# Patient Record
Sex: Female | Born: 1995 | Hispanic: No | Marital: Single | Smoking: Never smoker
Health system: Southern US, Community
[De-identification: ages and names within clinical notes are randomized; demographics above are authoritative.]

## PROBLEM LIST (undated history)

## (undated) DIAGNOSIS — D219 Benign neoplasm of connective and other soft tissue, unspecified: Secondary | ICD-10-CM

## (undated) HISTORY — DX: Benign neoplasm of connective and other soft tissue, unspecified: D21.9

---

## 2016-08-05 ENCOUNTER — Emergency Department (HOSPITAL_COMMUNITY)
Admission: EM | Admit: 2016-08-05 | Discharge: 2016-08-05 | Disposition: A | Discharge status: Home / Self Care | Payer: Self-pay | Attending: Emergency Medicine | Admitting: Emergency Medicine

## 2016-08-05 ENCOUNTER — Encounter (HOSPITAL_COMMUNITY): Payer: Self-pay | Admitting: Emergency Medicine

## 2016-08-05 DIAGNOSIS — Y939 Activity, unspecified: Secondary | ICD-10-CM | POA: Insufficient documentation

## 2016-08-05 DIAGNOSIS — X500XXA Overexertion from strenuous movement or load, initial encounter: Secondary | ICD-10-CM | POA: Insufficient documentation

## 2016-08-05 DIAGNOSIS — Y929 Unspecified place or not applicable: Secondary | ICD-10-CM | POA: Insufficient documentation

## 2016-08-05 DIAGNOSIS — Y999 Unspecified external cause status: Secondary | ICD-10-CM | POA: Insufficient documentation

## 2016-08-05 DIAGNOSIS — M67431 Ganglion, right wrist: Secondary | ICD-10-CM | POA: Insufficient documentation

## 2016-08-05 DIAGNOSIS — M6740 Ganglion, unspecified site: Secondary | ICD-10-CM

## 2016-08-05 NOTE — ED Notes (Signed)
Pt has cyst on right hand. Her mother and sister also have these.

## 2016-08-05 NOTE — Discharge Instructions (Signed)
Please read attached information. If you experience any new or worsening signs or symptoms please return to the emergency room for evaluation. Please follow-up with your primary care provider or specialist as discussed.  °

## 2016-08-05 NOTE — ED Provider Notes (Signed)
[place] DEPT Provider Note   CSN: [id number] Arrival date & time: 08/05/16  1049  By signing my name below, I, Theresia Bough, attest that this documentation has been prepared under the direction and in the presence of [employer], PA-C.  Electronically Signed: Theresia Bough, ED Scribe. 08/05/16. 12:25 PM.  History   Chief Complaint Chief Complaint  Patient presents with  . Cyst   The history is provided by the patient. No language interpreter was used.    HPI Comments: Brenda Manning is a 21 y.o. female who presents to the Emergency Department complaining of a moderate, gradually worsening area of pain and swelling to the right dorsal wrist onset 1-2 months ago. Per pt, she has a h/o similar to the same area which have been recurrent over the past nine years. Pt states pain is exacerbated holding heavy objects and with usage of the hand. No noted treatments for her symptoms were tried prior to coming into the ED. She has a maternal FHx of the same. Denies fever, chills, drainage from the area.   History reviewed. No pertinent past medical history.  There are no active problems to display for this patient.  History reviewed. No pertinent surgical history.  OB History    No data available     Home Medications    Prior to Admission medications   Not on File   Family History No family history on file.  Social History Social History  Substance Use Topics  . Smoking status: Not on file  . Smokeless tobacco: Not on file  . Alcohol use Not on file   Allergies   Patient has no known allergies.  Review of Systems Review of Systems  Constitutional: Negative for chills and fever.  Musculoskeletal: Positive for myalgias.  All other systems reviewed and are negative.  Physical Exam Updated Vital Signs BP 130/74   Pulse 66   Temp 98.7 F (37.1 C) (Oral)   Resp 12   LMP 07/15/2016 (Exact Date)   SpO2 100%   Physical Exam  Constitutional: She is oriented  to person, place, and time. She appears well-developed and well-nourished.  HENT:  Head: Normocephalic and atraumatic.  Cardiovascular: Normal rate.   Pulmonary/Chest: Effort normal.  Musculoskeletal:  Ganglion cyst to the right dorsal wrist  Neurological: She is alert and oriented to person, place, and time.  Skin: Skin is warm and dry.  Psychiatric: She has a normal mood and affect.  Nursing note and vitals reviewed.  ED Treatments / Results  DIAGNOSTIC STUDIES: Oxygen Saturation is 100% on RA, normal by my interpretation.   COORDINATION OF CARE: 12:05 PM-Discussed next steps with pt. Pt verbalized understanding and is agreeable with the plan.   Labs (all labs ordered are listed, but only abnormal results are displayed) Labs Reviewed - No data to display  EKG  EKG Interpretation None      Radiology No results found.  Procedures Procedures   Medications Ordered in ED Medications - No data to display  Initial Impression / Assessment and Plan / ED Course  I have reviewed the triage vital signs and the nursing notes.  Pertinent labs & imaging results that were available during my care of the patient were reviewed by me and considered in my medical decision making (see chart for details).     Labs:   Imaging:   Consults:   Therapeutics:   Discharge Meds:  Assessment/Plan:   21 year old female presents today with likely ganglion cyst.  No signs  of infectious etiology.  This is chronic in nature.  She referred to hand surgery, strict return precautions given.  She verbalized understanding and agreement to today's plan had no further questions or concerns  Final Clinical Impressions(s) / ED Diagnoses   Final diagnoses:  Ganglion cyst    New Prescriptions New Prescriptions   No medications on file   I personally performed the services described in this documentation, which was scribed in my presence. The recorded information has been reviewed and is  accurate.    Okey Regal, PA-C 08/05/16 1238    Virgel Manifold, MD 08/09/16 [phone]

## 2016-08-05 NOTE — ED Triage Notes (Signed)
Pt reports recurring cysts on her wrist, current one on right wrist has been present x2 months and has grown in size since initially appearing.

## 2019-07-25 ENCOUNTER — Other Ambulatory Visit: Payer: Self-pay

## 2019-08-04 ENCOUNTER — Emergency Department (HOSPITAL_COMMUNITY)
Admission: EM | Admit: 2019-08-04 | Discharge: 2019-08-05 | Disposition: A | Discharge status: Home / Self Care | Payer: Self-pay | Attending: Emergency Medicine | Admitting: Emergency Medicine

## 2019-08-04 ENCOUNTER — Other Ambulatory Visit: Payer: Self-pay

## 2019-08-04 ENCOUNTER — Encounter (HOSPITAL_COMMUNITY): Payer: Self-pay | Admitting: Emergency Medicine

## 2019-08-04 DIAGNOSIS — R102 Pelvic and perineal pain: Secondary | ICD-10-CM | POA: Insufficient documentation

## 2019-08-04 DIAGNOSIS — R109 Unspecified abdominal pain: Secondary | ICD-10-CM

## 2019-08-04 LAB — CBC
HCT: 38.4 % (ref 36.0–46.0)
Hemoglobin: 12.7 g/dL (ref 12.0–15.0)
MCH: 30 pg (ref 26.0–34.0)
MCHC: 33.1 g/dL (ref 30.0–36.0)
MCV: 90.8 fL (ref 80.0–100.0)
Platelets: 255 10*3/uL (ref 150–400)
RBC: 4.23 MIL/uL (ref 3.87–5.11)
RDW: 12.7 % (ref 11.5–15.5)
WBC: 7 10*3/uL (ref 4.0–10.5)
nRBC: 0 % (ref 0.0–0.2)

## 2019-08-04 LAB — URINALYSIS, ROUTINE W REFLEX MICROSCOPIC
Bilirubin Urine: NEGATIVE
Glucose, UA: NEGATIVE mg/dL
Hgb urine dipstick: NEGATIVE
Ketones, ur: NEGATIVE mg/dL
Leukocytes,Ua: NEGATIVE
Nitrite: NEGATIVE
Protein, ur: NEGATIVE mg/dL
Specific Gravity, Urine: 1.006 (ref 1.005–1.030)
pH: 5 (ref 5.0–8.0)

## 2019-08-04 LAB — I-STAT BETA HCG BLOOD, ED (MC, WL, AP ONLY): I-stat hCG, quantitative: <5 m[IU]/mL (ref <5)

## 2019-08-04 NOTE — ED Triage Notes (Signed)
Pt c/o 9/10 right flank pain radiating all the way to her right lower abd. Pt denies any urinary symptoms.

## 2019-08-05 ENCOUNTER — Emergency Department (HOSPITAL_COMMUNITY): Payer: Self-pay

## 2019-08-05 LAB — BASIC METABOLIC PANEL
Anion gap: 9 (ref 5–15)
BUN: 13 mg/dL (ref 6–20)
CO2: 27 mmol/L (ref 22–32)
Calcium: 9.5 mg/dL (ref 8.9–10.3)
Chloride: 104 mmol/L (ref 98–111)
Creatinine, Ser: 0.83 mg/dL (ref 0.44–1.00)
GFR calc Af Amer: >60 mL/min (ref >60)
GFR calc non Af Amer: >60 mL/min (ref >60)
Glucose, Bld: 96 mg/dL (ref 70–99)
Potassium: 4.1 mmol/L (ref 3.5–5.1)
Sodium: 140 mmol/L (ref 135–145)

## 2019-08-05 NOTE — Discharge Instructions (Addendum)
There are three uterine fibroids on ultrasound.  Follow-up with GYN regarding uterine fibroids as it may contribute to your pain.  Call for appt.

## 2019-08-05 NOTE — ED Notes (Signed)
ED Provider at bedside. 

## 2019-08-05 NOTE — ED Provider Notes (Signed)
[place] EMERGENCY DEPARTMENT Provider Note   CSN: [id number] Arrival date & time: 08/04/19  2230     History Chief Complaint  Patient presents with  . Flank Pain    Brenda Manning is a 24 y.o. female.  The history is provided by the patient and medical records.  Flank Pain   24 y.o. F here with right sided lower abdominal and flank pain.  States this began on Monday and has been worsening since onset.  She states pain radiates into her back, often times has had a hard time getting comfortable.  She reports nausea and vomiting.  No diarrhea.  No fever/chills.  Denies dysuria or hematuria.  No vaginal discharge.  No concern of STD.  History reviewed. No pertinent past medical history.  There are no problems to display for this patient.   History reviewed. No pertinent surgical history.   OB History   No obstetric history on file.     History reviewed. No pertinent family history.  Social History   Tobacco Use  . Smoking status: Never Smoker  . Smokeless tobacco: Never Used  Substance Use Topics  . Alcohol use: Never  . Drug use: Never    Home Medications Prior to Admission medications   Not on File    Allergies    Patient has no known allergies.  Review of Systems   Review of Systems  Genitourinary: Positive for flank pain.  All other systems reviewed and are negative.   Physical Exam Updated Vital Signs BP 114/85   Pulse 77   Temp 97.7 F (36.5 C) (Oral)   Resp (!) 22   Ht 5\' 4"  (1.626 m)   Wt 54.4 kg   SpO2 100%   BMI 20.60 kg/m   Physical Exam Vitals and nursing note reviewed.  Constitutional:      Appearance: She is well-developed.  HENT:     Head: Normocephalic and atraumatic.  Eyes:     Conjunctiva/sclera: Conjunctivae normal.     Pupils: Pupils are equal, round, and reactive to light.  Cardiovascular:     Rate and Rhythm: Normal rate and regular rhythm.     Heart sounds: Normal heart sounds.  Pulmonary:       Effort: Pulmonary effort is normal. No respiratory distress.     Breath sounds: Normal breath sounds. No rhonchi.  Abdominal:     General: Bowel sounds are normal.     Palpations: Abdomen is soft.     Tenderness: There is no abdominal tenderness. There is no rebound.     Comments: Soft, non-tender  Musculoskeletal:        General: Normal range of motion.     Cervical back: Normal range of motion.  Skin:    General: Skin is warm and dry.  Neurological:     Mental Status: She is alert and oriented to person, place, and time.     ED Results / Procedures / Treatments   Labs (all labs ordered are listed, but only abnormal results are displayed) Labs Reviewed  URINALYSIS, ROUTINE W REFLEX MICROSCOPIC - Abnormal; Notable for the following components:      Result Value   Color, Urine STRAW (*)    All other components within normal limits  BASIC METABOLIC PANEL  CBC  I-STAT BETA HCG BLOOD, ED ([facility], [facility], [facility] ONLY)    EKG None  Radiology CT Renal Stone Study  Result Date: 08/05/2019 CLINICAL DATA:  Right flank pain radiating to the right  lower quadrant. EXAM: CT ABDOMEN AND PELVIS WITHOUT CONTRAST TECHNIQUE: Multidetector CT imaging of the abdomen and pelvis was performed following the standard protocol without IV contrast. COMPARISON:  None. FINDINGS: Lower chest: No significant pulmonary nodules or acute consolidative airspace disease. Hepatobiliary: Normal liver size. No liver mass. Normal gallbladder with no radiopaque cholelithiasis. No biliary ductal dilatation. Pancreas: Normal, with no mass or duct dilation. Spleen: Normal size. No mass. Adrenals/Urinary Tract: Normal adrenals. No renal stones. No hydronephrosis. No contour deforming renal masses. Normal bladder. Stomach/Bowel: Normal non-distended stomach. Normal caliber small bowel with no small bowel wall thickening. Appendix not discretely visualized. No significant pericecal inflammatory changes. Normal large bowel with no  diverticulosis, large bowel wall thickening or pericolonic fat stranding. Vascular/Lymphatic: Normal caliber abdominal aorta. No pathologically enlarged lymph nodes in the abdomen or pelvis. Reproductive: Bulky myomatous uterus with peripherally calcified 5.2 cm posterior uterine body fibroid and with a large solid 9.6 x 6.1 cm right pelvic mass (series 3/image 54) appearing to represent an exophytic right fundal uterine fibroid. No left adnexal mass. Other: No pneumoperitoneum, ascites or focal fluid collection. Musculoskeletal: No aggressive appearing focal osseous lesions. IMPRESSION: 1. Bulky myomatous uterus with a large solid 9.6 x 6.1 cm right pelvic mass, appearing to represent an exophytic right fundal uterine fibroid. Recommend correlation with transabdominal and transvaginal pelvic ultrasound. 2. No urolithiasis. No hydronephrosis. No acute bowel abnormality on this noncontrast scan. Electronically Signed   By: Ilona Sorrel M.D.   On: 08/05/2019 05:27    Procedures Procedures (including critical care time)  Medications Ordered in ED Medications - No data to display  ED Course  I have reviewed the triage vital signs and the nursing notes.  Pertinent labs & imaging results that were available during my care of the patient were reviewed by me and considered in my medical decision making (see chart for details).    MDM Rules/Calculators/A&P  24 y.o. F here with right sided abdominal/flank pain.  Has been worsening since Monday.  No urinary symptoms, vaginal discharge, or unusual bleeding.  She is afebrile, non-toxic.  Abdomen overall soft and non-tender.  Labs from triage are reassuring.  CT renal study with findings of likely uterine fibroids but pelvic US recommended.  Patient denies history of fibroids but reports family history of same (mother and aunt).  Pelvic US pending.  Can likely discharge home with GYN follow-up for fibroids if no acute/emergent findings.  Care signed out to PA  Rona Ravens to follow-up on US findings and disposition.  Final Clinical Impression(s) / ED Diagnoses Final diagnoses:  Pelvic pain  Right flank pain    Rx / DC Orders ED Discharge Orders    None       Larene Pickett, PA-C 08/05/19 0709    Ward, Delice Bison, DO 08/05/19 [phone]

## 2019-08-16 ENCOUNTER — Encounter: Payer: Self-pay | Admitting: Physician Assistant

## 2019-08-16 ENCOUNTER — Other Ambulatory Visit: Payer: Self-pay

## 2019-08-16 ENCOUNTER — Ambulatory Visit: Payer: Medicaid Other | Admitting: Physician Assistant

## 2019-08-16 ENCOUNTER — Other Ambulatory Visit: Payer: Self-pay | Admitting: Physician Assistant

## 2019-08-16 VITALS — BP 108/89 | HR 81 | Temp 98.2°F | Resp 18

## 2019-08-16 DIAGNOSIS — D219 Benign neoplasm of connective and other soft tissue, unspecified: Secondary | ICD-10-CM

## 2019-08-16 MED ORDER — IBUPROFEN 800 MG PO TABS: 800.0000 mg | ORAL_TABLET | Freq: Three times a day (TID) | ORAL | 0 refills | Status: AC | PRN

## 2019-08-16 NOTE — Patient Instructions (Signed)

## 2019-08-16 NOTE — Progress Notes (Unsigned)
New Patient Office Visit  Subjective:  Patient ID: Brenda Manning, female    DOB: Jun 22, 1995  Age: 24 y.o. MRN: [id number]  CC: No chief complaint on file.   HPI Brenda Manning presents for ***   24 y.o. F here with right sided abdominal/flank pain.  Has been worsening since Monday.  No urinary symptoms, vaginal discharge, or unusual bleeding.  She is afebrile, non-toxic.  Abdomen overall soft and non-tender.  Labs from triage are reassuring.  CT renal study with findings of likely uterine fibroids but pelvic US recommended.  Patient denies history of fibroids but reports family history of same (mother and aunt).  Pelvic US pending.  Can likely discharge home with GYN follow-up for fibroids if no acute/emergent findings.  Care signed out to PA Rona Ravens to follow-up on US findings and disposition.  FINDINGS: Uterus  Measurements: 11.4 x 7.8 x 6.4 = volume: 298 mL. Three fibroids are identified:  6.6 x 5.9 x 6.5 cm exophytic  5 x 5.4 x 5.1 cm likely with a submucosal component  3.1 x 1.9 x 2.7 cm with a subserosal component  Endometrium  Thickness: 7 mm.  No focal abnormality visualized.  Right ovary  Measurements: 4.1 x 2 x 4 cm = volume: 17.3 mL. Normal appearance/no adnexal mass.  Left ovary  Measurements: 3.3 x 1.4 x 1.6 cm = volume: 3.8 mL. Normal appearance/no adnexal mass.  Pulsed Doppler evaluation demonstrates normal low-resistance arterial and venous waveforms in right ovary. Suboptimal Doppler evaluation of the left ovary due to location.  Other: Trace free fluid, likely physiologic  IMPRESSION: Myomatous uterus.  Ovaries are unremarkable.   Electronically Signed   By: Macy Mis M.D.   On: 08/05/2019 07:10  No past medical history on file.  No past surgical history on file.  No family history on file.  Social History   Socioeconomic History  . Marital status: Single    Spouse name: Not on file  . Number of  children: Not on file  . Years of education: Not on file  . Highest education level: Not on file  Occupational History  . Not on file  Tobacco Use  . Smoking status: Never Smoker  . Smokeless tobacco: Never Used  Substance and Sexual Activity  . Alcohol use: Never  . Drug use: Never  . Sexual activity: Not on file  Other Topics Concern  . Not on file  Social History Narrative  . Not on file   Social Determinants of Health   Financial Resource Strain:   . Difficulty of Paying Living Expenses:   Food Insecurity:   . Worried About Charity fundraiser in the Last Year:   . Arboriculturist in the Last Year:   Transportation Needs:   . Film/video editor (Medical):   Marland Kitchen Lack of Transportation (Non-Medical):   Physical Activity:   . Days of Exercise per Week:   . Minutes of Exercise per Session:   Stress:   . Feeling of Stress :   Social Connections:   . Frequency of Communication with Friends and Family:   . Frequency of Social Gatherings with Friends and Family:   . Attends Religious Services:   . Active Member of Clubs or Organizations:   . Attends Archivist Meetings:   Marland Kitchen Marital Status:   Intimate Partner Violence:   . Fear of Current or Ex-Partner:   . Emotionally Abused:   Marland Kitchen Physically Abused:   . Sexually Abused:  ROS Review of Systems  Objective:   Today's Vitals: There were no vitals taken for this visit.  Physical Exam  Assessment & Plan:   Problem List Items Addressed This Visit    None      Outpatient Encounter Medications as of 08/16/2019  Medication Sig  . ibuprofen (ADVIL) 800 MG tablet Take 1 tablet (800 mg total) by mouth every 8 (eight) hours as needed.   No facility-administered encounter medications on file as of 08/16/2019.    Follow-up: No follow-ups on file.   Loraine Grip Mayers, PA-C

## 2019-08-16 NOTE — Progress Notes (Signed)
New Patient Office Visit  Subjective:  Patient ID: Brenda Manning, female    DOB: 01-08-1996  Age: 24 y.o. MRN: [id number]  CC:  Chief Complaint  Patient presents with  . Abdominal Pain    uterine fibroids    HPI [name]  Reports that she was seen at [facility] emergency department on May 1 with the same complaint.  Summary and results of transvaginal ultrasound: 24 y.o. F here with right sided abdominal/flank pain. Has been worsening since Monday. No urinary symptoms, vaginal discharge, or unusual bleeding. She is afebrile, non-toxic. Abdomen overall soft and non-tender. Labs from triage are reassuring. CT renal study with findings of likely uterine fibroids but pelvic US recommended. Patient denies history of fibroids but reports family history of same (mother and aunt).  Pelvic US pending. Can likely discharge home with GYN follow-up for fibroids if no acute/emergent findings. Care signed out to PA Rona Ravens to follow-up on US findings and disposition.  FINDINGS: Uterus  Measurements: 11.4 x 7.8 x 6.4 = volume: 298 mL. Three fibroids are identified:  6.6 x 5.9 x 6.5 cm exophytic  5 x 5.4 x 5.1 cm likely with a submucosal component  3.1 x 1.9 x 2.7 cm with a subserosal component  Endometrium  Thickness: 7 mm. No focal abnormality visualized.  Right ovary  Measurements: 4.1 x 2 x 4 cm = volume: 17.3 mL. Normal appearance/no adnexal mass.  Left ovary  Measurements: 3.3 x 1.4 x 1.6 cm = volume: 3.8 mL. Normal appearance/no adnexal mass.  Pulsed Doppler evaluation demonstrates normal low-resistance arterial and venous waveforms in right ovary. Suboptimal Doppler evaluation of the left ovary due to location.  Other: Trace free fluid, likely physiologic  IMPRESSION: Myomatous uterus.  Ovaries are unremarkable.   Electronically Signed By: Macy Mis M.D. On: 08/05/2019 07:10  Reports that she continues to have  pelvic pain, has not tried anything for relief, has upcoming appointment at [facility] [place] for further evaluation, but unfortunately appointment is not till the end of June.   History reviewed. No pertinent past medical history.  History reviewed. No pertinent surgical history.  History reviewed. No pertinent family history.  Social History   Socioeconomic History  . Marital status: Single    Spouse name: Not on file  . Number of children: Not on file  . Years of education: Not on file  . Highest education level: Not on file  Occupational History  . Not on file  Tobacco Use  . Smoking status: Never Smoker  . Smokeless tobacco: Never Used  Substance and Sexual Activity  . Alcohol use: Never  . Drug use: Never  . Sexual activity: Not Currently  Other Topics Concern  . Not on file  Social History Narrative  . Not on file   Social Determinants of Health   Financial Resource Strain:   . Difficulty of Paying Living Expenses:   Food Insecurity:   . Worried About Charity fundraiser in the Last Year:   . Arboriculturist in the Last Year:   Transportation Needs:   . Film/video editor (Medical):   Marland Kitchen Lack of Transportation (Non-Medical):   Physical Activity:   . Days of Exercise per Week:   . Minutes of Exercise per Session:   Stress:   . Feeling of Stress :   Social Connections:   . Frequency of Communication with Friends and Family:   . Frequency of Social Gatherings with Friends and Family:   .  Attends Religious Services:   . Active Member of Clubs or Organizations:   . Attends Archivist Meetings:   Marland Kitchen Marital Status:   Intimate Partner Violence:   . Fear of Current or Ex-Partner:   . Emotionally Abused:   Marland Kitchen Physically Abused:   . Sexually Abused:     ROS Review of Systems  Constitutional: Negative for chills and fever.  HENT: Negative.   Eyes: Negative.   Respiratory: Negative.   Cardiovascular: Negative.   Gastrointestinal: Negative.    Endocrine: Negative.   Genitourinary: Positive for flank pain and pelvic pain. Negative for vaginal bleeding.  Skin: Negative.   Allergic/Immunologic: Negative.   Neurological: Negative.   Hematological: Negative.   Psychiatric/Behavioral: Negative.     Objective:   Today's Vitals: BP 108/89 (BP Location: Left Arm, Patient Position: Sitting, Cuff Size: Normal)   Pulse 81   Temp 98.2 F (36.8 C) (Temporal)   Resp 18   SpO2 100%   Physical Exam Vitals and nursing note reviewed.  Constitutional:      General: She is not in acute distress.    Appearance: She is well-developed and normal weight. She is not ill-appearing.  HENT:     Head: Normocephalic and atraumatic.     Mouth/Throat:     Mouth: Mucous membranes are moist.     Pharynx: Oropharynx is clear.  Eyes:     Extraocular Movements: Extraocular movements intact.     Pupils: Pupils are equal, round, and reactive to light.  Cardiovascular:     Rate and Rhythm: Normal rate and regular rhythm.     Heart sounds: Normal heart sounds.  Pulmonary:     Effort: Pulmonary effort is normal.     Breath sounds: Normal breath sounds.  Abdominal:     General: Abdomen is flat. Bowel sounds are normal.     Palpations: Abdomen is rigid.     Tenderness: There is abdominal tenderness in the suprapubic area.  Skin:    General: Skin is warm and dry.  Neurological:     General: No focal deficit present.     Mental Status: She is alert.  Psychiatric:        Mood and Affect: Mood normal.        Behavior: Behavior normal.     Assessment & Plan:   Problem List Items Addressed This Visit    None    Visit Diagnoses    Fibroids    -  Primary    1. Fibroids Trial ibuprofen 800 mg every 8 hours as needed, we were able to get patient an earlier appointment, new appointment is on June 1 at [facility].  I have reviewed the patient's medical history (PMH, PSH, Social History, Family History, Medications, and allergies) , and  have been updated if relevant. I spent 20 minutes reviewing chart and  face to face time with patient.      No outpatient encounter medications on file as of 08/16/2019.   No facility-administered encounter medications on file as of 08/16/2019.    Follow-up: Return in about 3 weeks (around 09/06/2019) [facility].   Loraine Grip Senita Corredor, PA-C

## 2019-09-06 ENCOUNTER — Other Ambulatory Visit (HOSPITAL_COMMUNITY)
Admission: RE | Admit: 2019-09-06 | Discharge: 2019-09-06 | Disposition: A | Discharge status: Home / Self Care | Payer: 59 | Source: Ambulatory Visit | Attending: Obstetrics and Gynecology | Admitting: Obstetrics and Gynecology

## 2019-09-06 ENCOUNTER — Encounter: Payer: Self-pay | Admitting: Obstetrics and Gynecology

## 2019-09-06 ENCOUNTER — Other Ambulatory Visit: Payer: Self-pay

## 2019-09-06 ENCOUNTER — Ambulatory Visit (INDEPENDENT_AMBULATORY_CARE_PROVIDER_SITE_OTHER): Payer: 59 | Admitting: Obstetrics and Gynecology

## 2019-09-06 VITALS — BP 117/67 | HR 67 | Ht 62.0 in | Wt 117.8 lb

## 2019-09-06 DIAGNOSIS — Z124 Encounter for screening for malignant neoplasm of cervix: Secondary | ICD-10-CM

## 2019-09-06 DIAGNOSIS — Z113 Encounter for screening for infections with a predominantly sexual mode of transmission: Secondary | ICD-10-CM

## 2019-09-06 DIAGNOSIS — R102 Pelvic and perineal pain: Secondary | ICD-10-CM | POA: Diagnosis not present

## 2019-09-06 DIAGNOSIS — D259 Leiomyoma of uterus, unspecified: Secondary | ICD-10-CM | POA: Diagnosis not present

## 2019-09-06 MED ORDER — NORGESTIMATE-ETH ESTRADIOL 0.25-35 MG-MCG PO TABS
1.0000 | ORAL_TABLET | Freq: Every day | ORAL | 11 refills | Status: DC
Start: 2019-09-06 — End: 2020-07-15

## 2019-09-06 NOTE — Progress Notes (Signed)
GYNECOLOGY OFFICE FOLLOW UP NOTE  History:  24 y.o. G0P0000 here today for follow up for pelvic pain. Has been feeling something "flipping on her right side" for about 5 months. Period 2 months ago was very painful and went to ED because of the pain. Here for follow up. Last months period was painful but not as bad. Periods are irregular, will skip every 3rd month or so, lasting 2-3 days. Periods are light. Periods have always been irregular and light but pain is worsening.   Uses condoms for contraception.  Desires STI screen.  Past Medical History:  Diagnosis Date  . Fibroids     History reviewed. No pertinent surgical history.   Current Outpatient Medications:  .  ibuprofen (ADVIL) 800 MG tablet, Take 1 tablet (800 mg total) by mouth every 8 (eight) hours as needed., Disp: 30 tablet, Rfl: 0 .  norgestimate-ethinyl estradiol (ORTHO-CYCLEN) 0.25-35 MG-MCG tablet, Take 1 tablet by mouth daily., Disp: 1 Package, Rfl: 11  The following portions of the patient's history were reviewed and updated as appropriate: allergies, current medications, past family history, past medical history, past social history, past surgical history and problem list.   Review of Systems:  Pertinent items noted in HPI and remainder of comprehensive ROS otherwise negative.   Objective:  Physical Exam BP 117/67   Pulse 67   Ht 5\' 2"  (1.575 m)   Wt 117 lb 12.8 oz (53.4 kg)   LMP 08/30/2019   BMI 21.55 kg/m  CONSTITUTIONAL: Well-developed, well-nourished female in no acute distress.  HENT:  Normocephalic, atraumatic. External right and left ear normal. Oropharynx is clear and moist EYES: Conjunctivae and EOM are normal. Pupils are equal, round, and reactive to light. No scleral icterus.  NECK: Normal range of motion, supple, no masses SKIN: Skin is warm and dry. No rash noted. Not diaphoretic. No erythema. No pallor. NEUROLOGIC: Alert and oriented to person, place, and time. Normal reflexes, muscle tone  coordination. No cranial nerve deficit noted. PSYCHIATRIC: Normal mood and affect. Normal behavior. Normal judgment and thought content. CARDIOVASCULAR: Normal heart rate noted RESPIRATORY: Effort normal, no problems with respiration noted ABDOMEN: Soft, no distention noted.   PELVIC: normal appearing extrenal female genitalia, normal appearing vaginal mucosa and cervix, enlarged uterus to umbilicus, mobile and mildly tender, no adnexal masses palpable but mild right adnexal tenderness MUSCULOSKELETAL: Normal range of motion. No edema noted.  Exam done with chaperone present.  Labs and Imaging No results found.  Assessment & Plan:   1. Uterine leiomyoma, unspecified location - Reviewed fibroids, etiology, expected course, likelihood that she will need removal at some point given age and size - reviewed that as her pain is mostly during her period, stoppign ovulation may improve pain, but may not improve pain related to fibroid movement, she would like to try this first - she would like to attempt pregnancy at some point, reviewed that fibroids may interfere with pregnancy but would not recommend proceeding with removal given risks of surgery and also that she has not attempted to get pregnant and may not be an issue - pt verbalizes understanding of the above will start OCPs and f/u 3 months  2. Pelvic pain See above  3. Routine screening for STI (sexually transmitted infection) - Hepatitis B surface antigen - Hepatitis C antibody - HIV Antibody (routine testing w rflx) - RPR  4. Cervical cancer screening - Cytology - PAP( [place])   Routine preventative health maintenance measures emphasized. Please refer to After  Visit Summary for other counseling recommendations.   Return in about 3 months (around 12/07/2019) for Followup.  Total face-to-face time with patient: 25 minutes. Over 50% of encounter was spent on counseling and coordination of care.  Feliz Beam,  M.D. Attending Center for [employer] [facility])

## 2019-09-07 LAB — HIV ANTIBODY (ROUTINE TESTING W REFLEX): HIV Screen 4th Generation wRfx: NONREACTIVE

## 2019-09-07 LAB — HEPATITIS B SURFACE ANTIGEN: Hepatitis B Surface Ag: NEGATIVE

## 2019-09-07 LAB — HEPATITIS C ANTIBODY: Hep C Virus Ab: <0.1 s/co ratio (ref 0.0–0.9)

## 2019-09-07 LAB — RPR: RPR Ser Ql: NONREACTIVE

## 2019-09-10 LAB — CYTOLOGY - PAP
Chlamydia: NEGATIVE
Comment: NEGATIVE
Comment: NEGATIVE
Comment: NORMAL
Diagnosis: NEGATIVE
Neisseria Gonorrhea: NEGATIVE
Trichomonas: NEGATIVE

## 2019-09-24 ENCOUNTER — Encounter: Payer: Medicaid Other | Admitting: Obstetrics & Gynecology

## 2020-04-29 ENCOUNTER — Ambulatory Visit: Payer: 59 | Attending: Internal Medicine

## 2020-04-29 DIAGNOSIS — Z23 Encounter for immunization: Secondary | ICD-10-CM

## 2020-04-29 NOTE — Progress Notes (Signed)
   Covid-19 Vaccination Clinic  Name:  Veronnica Hennings    MRN: [id number] DOB: 1995-06-27  04/29/2020  Ms. Kassabian was observed post Covid-19 immunization for 15 minutes without incident. She was provided with Vaccine Information Sheet and instruction to access the V-Safe system.   Ms. Leicht was instructed to call 911 with any severe reactions post vaccine: Marland Kitchen Difficulty breathing  . Swelling of face and throat  . A fast heartbeat  . A bad rash all over body  . Dizziness and weakness   Immunizations Administered    Name Date Dose VIS Date Route   Moderna COVID-19 Vaccine 04/29/2020  3:06 PM 0.5 mL 01/23/2020 Intramuscular   Manufacturer: Moderna   Lot: [id number]   [place]: [id number]

## 2020-05-27 ENCOUNTER — Ambulatory Visit: Payer: 59

## 2020-07-15 ENCOUNTER — Other Ambulatory Visit: Payer: Self-pay | Admitting: Family Medicine

## 2020-07-15 ENCOUNTER — Other Ambulatory Visit: Payer: Self-pay | Admitting: Obstetrics and Gynecology

## 2020-07-15 MED ORDER — NORGESTIMATE-ETH ESTRADIOL 0.25-35 MG-MCG PO TABS: 1.0000 | ORAL_TABLET | Freq: Every day | ORAL | 3 refills | Status: DC

## 2021-04-27 IMAGING — CT CT RENAL STONE PROTOCOL
2 of 4 series · 15 of 46 positions shown, 17 images · non-contrast
Comparison: None.

CLINICAL DATA: Right flank pain radiating to the right lower
quadrant.

EXAM:
CT ABDOMEN AND PELVIS WITHOUT CONTRAST
TECHNIQUE: Multidetector CT imaging of the abdomen and pelvis was performed
following the standard protocol without IV contrast.

[Series 3: renal stone 5.0 · axial · 0.79mm/px · z∈[+826,+1221]mm · 12 of 91 slices shown, 14 images]
[im 8/91  soft-tissue]
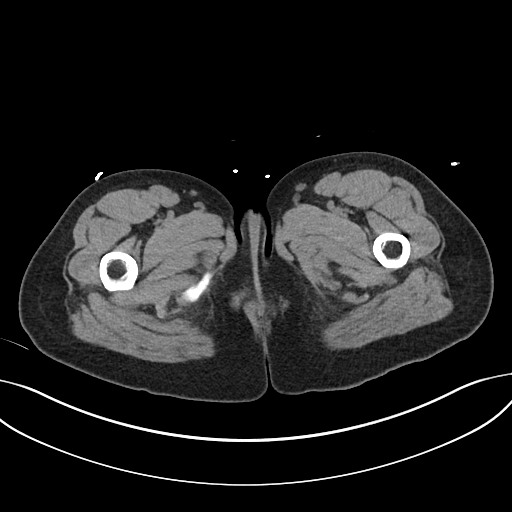
[im 8/91  bone]
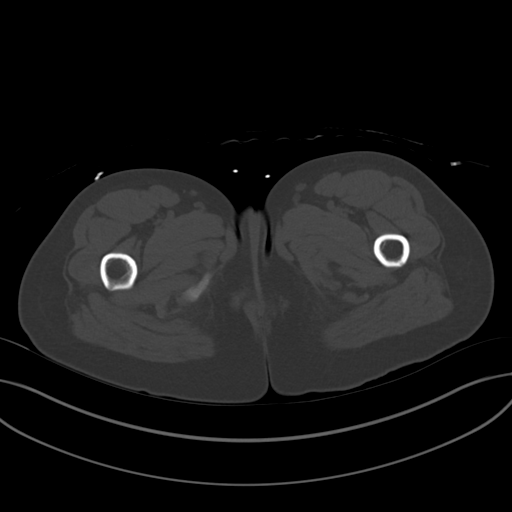
[im 15/91  soft-tissue]
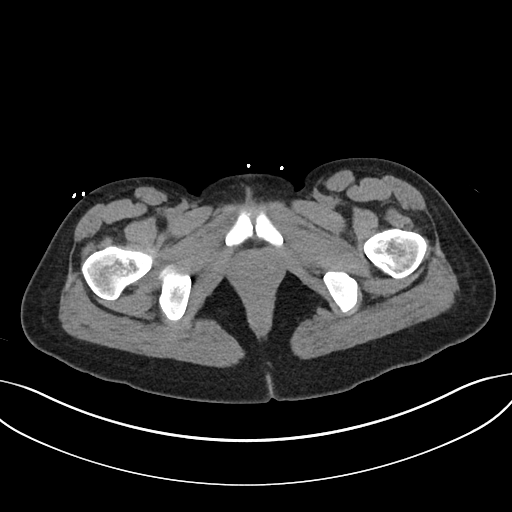
[im 22/91  soft-tissue]
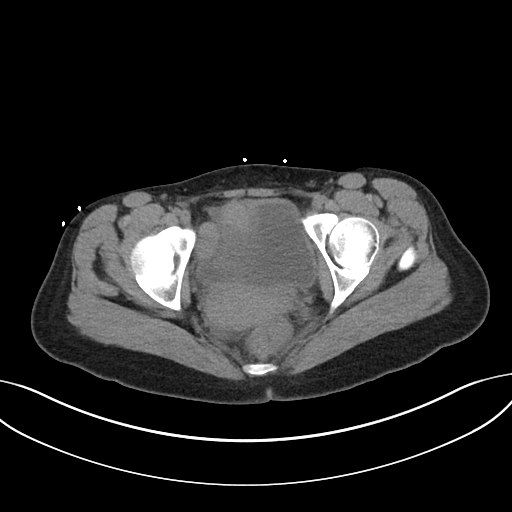
[im 29/91  soft-tissue]
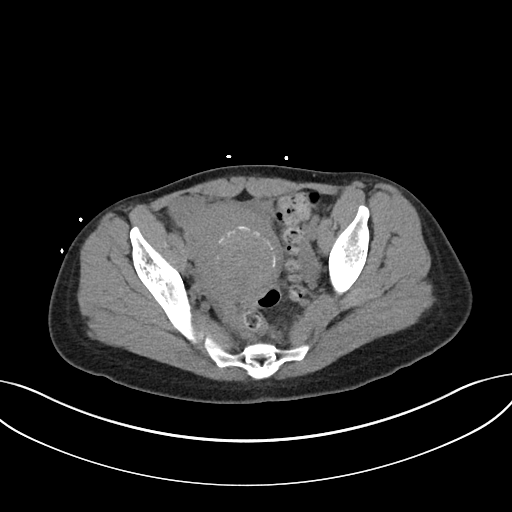
[im 37/91  soft-tissue]
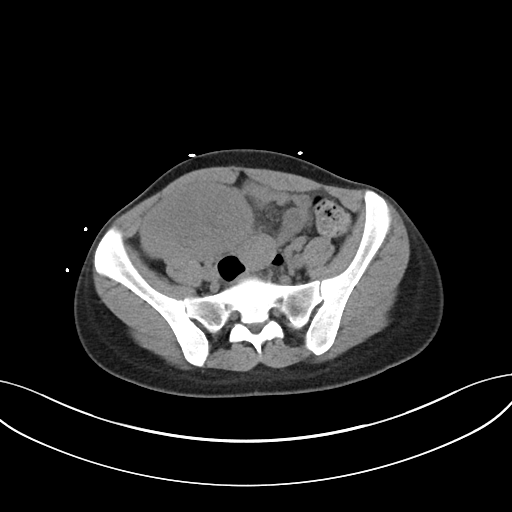
[im 44/91  soft-tissue]
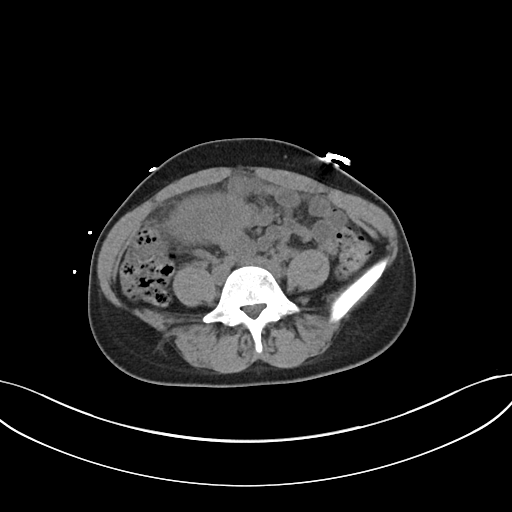
[im 51/91  soft-tissue]
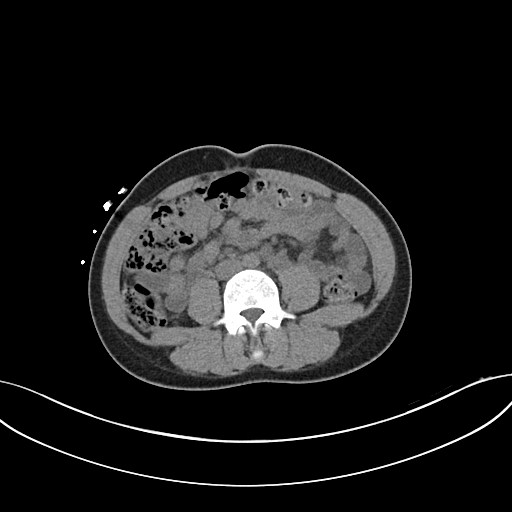
[im 58/91  soft-tissue]
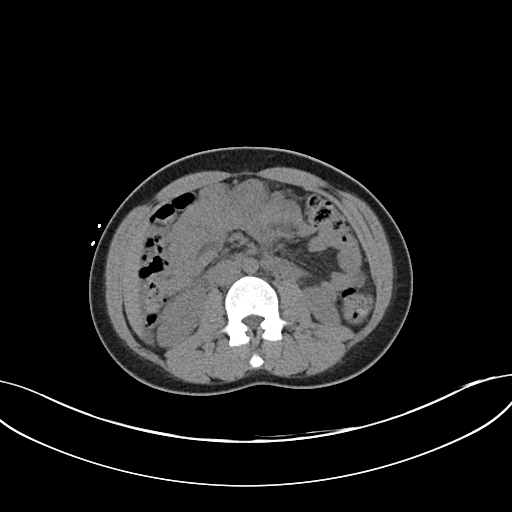
[im 65/91  soft-tissue]
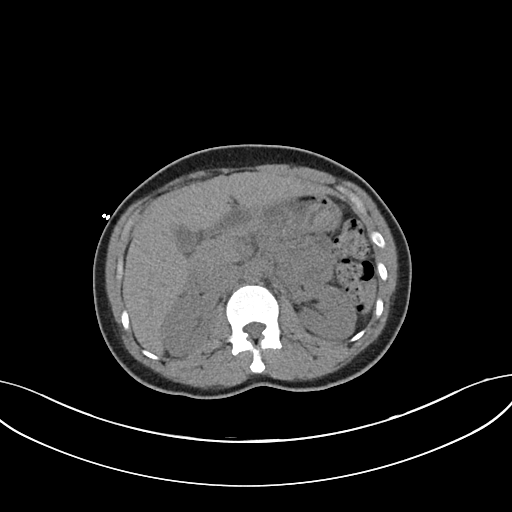
[im 65/91  bone]
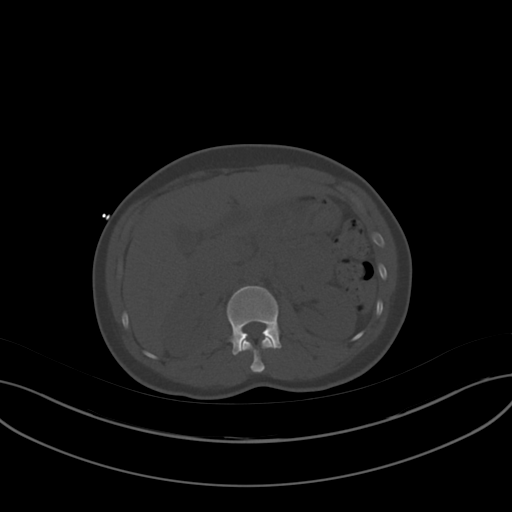
[im 73/91  soft-tissue]
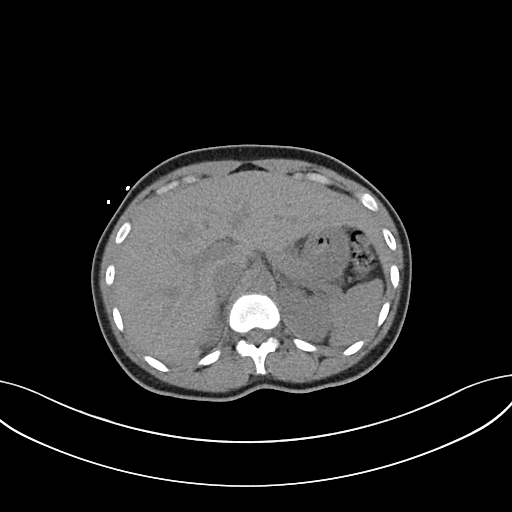
[im 80/91  soft-tissue]
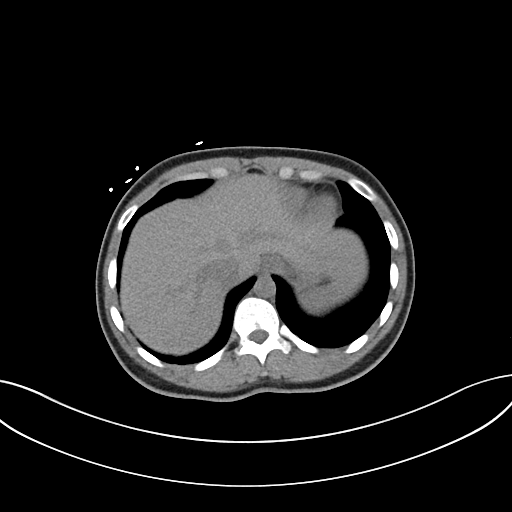
[im 87/91  soft-tissue]
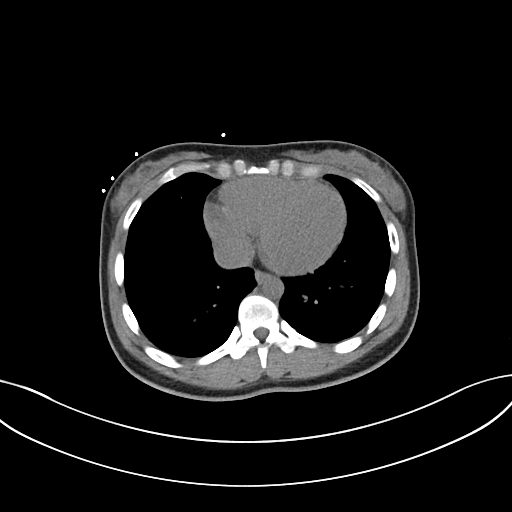

[Series 6: coronal · coronal · 0.61mm/px · 3 of 99 slices shown]
[im 33/99  soft-tissue]
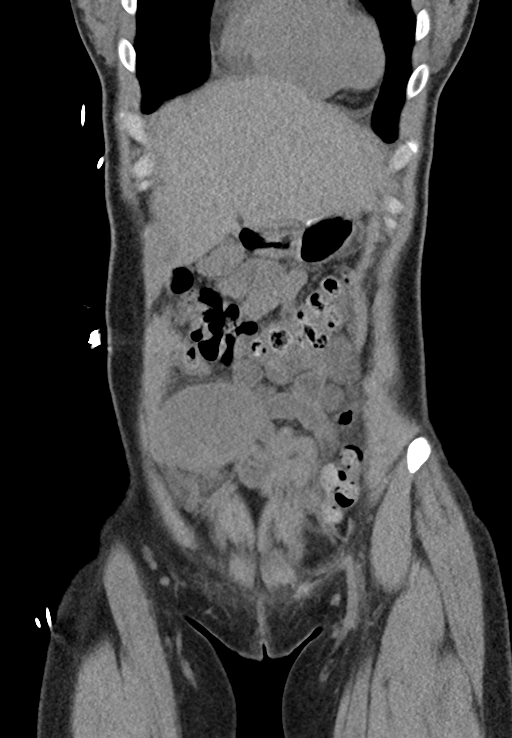
[im 44/99  soft-tissue]
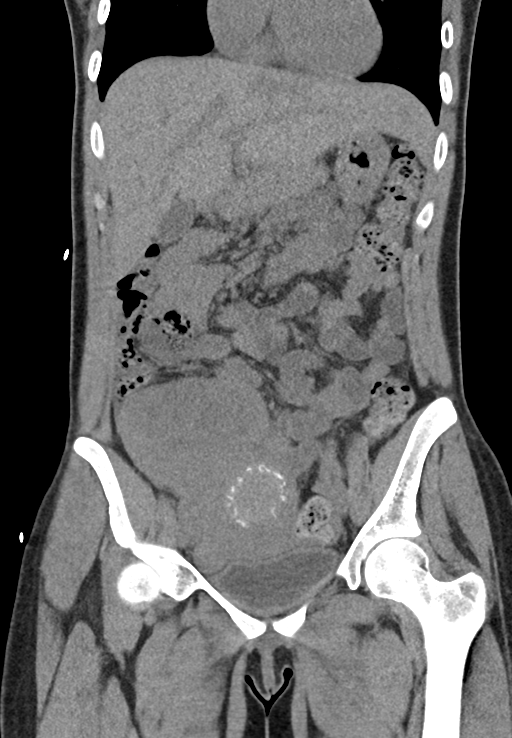
[im 55/99  soft-tissue]
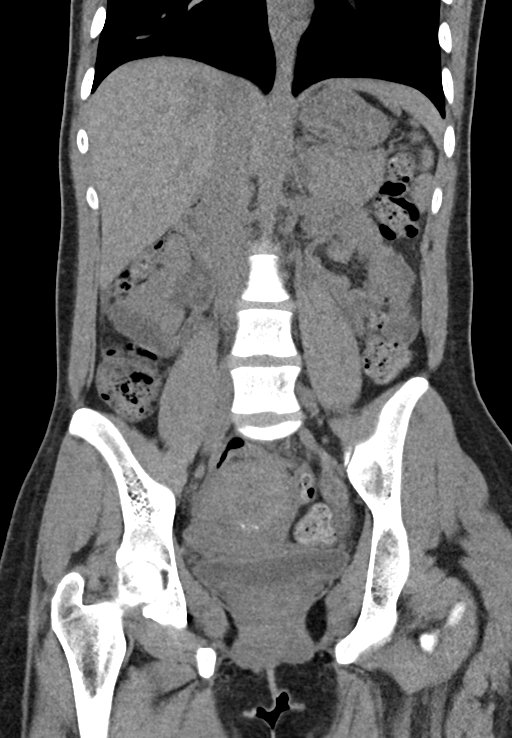

[15 of 46 positions shown; findings below may reference images not displayed]

FINDINGS: Lower chest: No significant pulmonary nodules or acute consolidative
airspace disease.

Hepatobiliary: Normal liver size. No liver mass. Normal gallbladder
with no radiopaque cholelithiasis. No biliary ductal dilatation.

Pancreas: Normal, with no mass or duct dilation.

Spleen: Normal size. No mass.

Adrenals/Urinary Tract: Normal adrenals. No renal stones. No
hydronephrosis. No contour deforming renal masses. Normal bladder.

Stomach/Bowel: Normal non-distended stomach. Normal caliber small
bowel with no small bowel wall thickening. Appendix not discretely
visualized. No significant pericecal inflammatory changes. Normal
large bowel with no diverticulosis, large bowel wall thickening or
pericolonic fat stranding.

Vascular/Lymphatic: Normal caliber abdominal aorta. No
pathologically enlarged lymph nodes in the abdomen or pelvis.

Reproductive: Bulky myomatous uterus with peripherally calcified
cm posterior uterine body fibroid and with a large solid 9.6 x
cm right pelvic mass (series 3/image 54) appearing to represent an
exophytic right fundal uterine fibroid. No left adnexal mass.

Other: No pneumoperitoneum, ascites or focal fluid collection.

Musculoskeletal: No aggressive appearing focal osseous lesions.
IMPRESSION: 1. Bulky myomatous uterus with a large solid 9.6 x 6.1 cm right
pelvic mass, appearing to represent an exophytic right fundal
uterine fibroid. Recommend correlation with transabdominal and
transvaginal pelvic ultrasound.
2. No urolithiasis. No hydronephrosis. No acute bowel abnormality on
this noncontrast scan.

## 2021-04-27 IMAGING — US US PELVIS COMPLETE
1 series · 14 of 25 positions shown · non-contrast
Comparison: None.

CLINICAL DATA: Fibroid uterus on CT

EXAM:
TRANSABDOMINAL ULTRASOUND OF PELVIS
DOPPLER ULTRASOUND OF OVARIES
TECHNIQUE: Transabdominal ultrasound examination of the pelvis was performed
including evaluation of the uterus, ovaries, adnexal regions, and
pelvic cul-de-sac.
Color and duplex Doppler ultrasound was utilized to evaluate blood
flow to the ovaries.

[Series 1: us pelvis complete · 71 acquisitions, 14 frames shown]
[im 1/71]
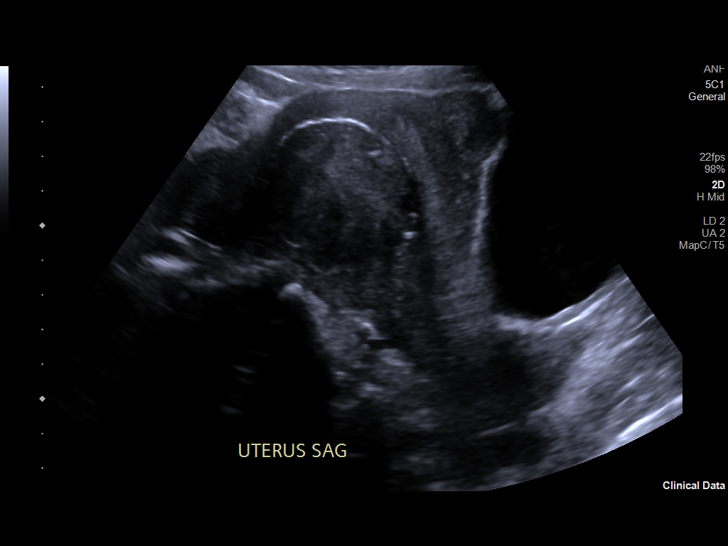
[im 6/71]
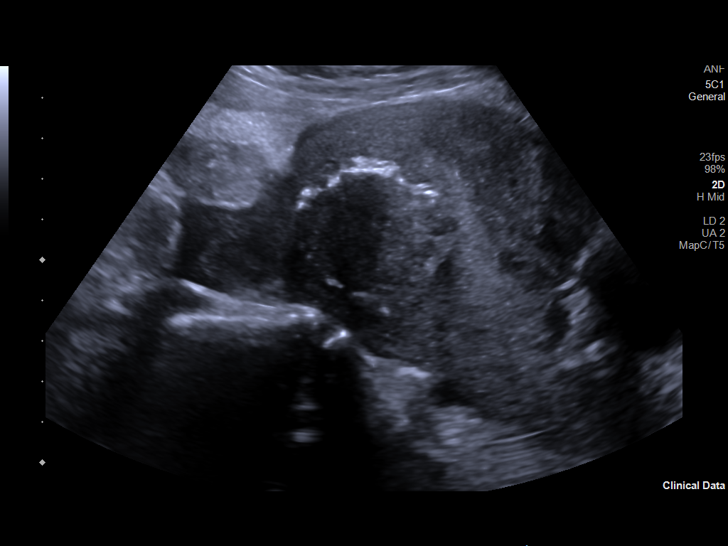
[im 12/71]
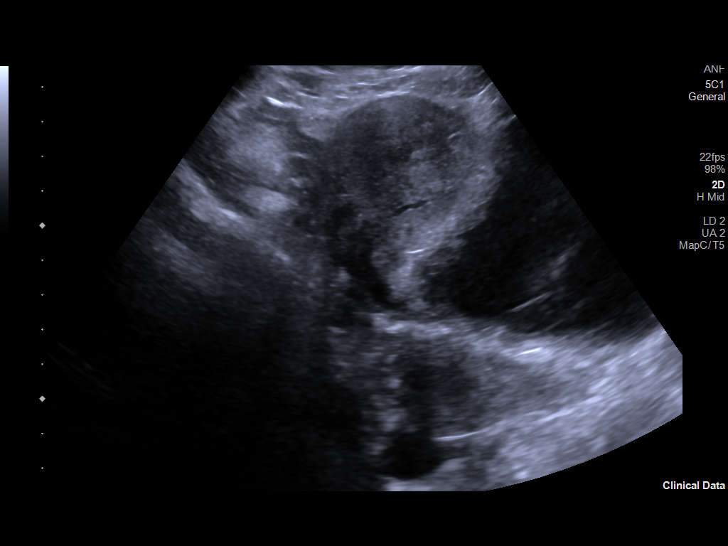
[im 18/71]
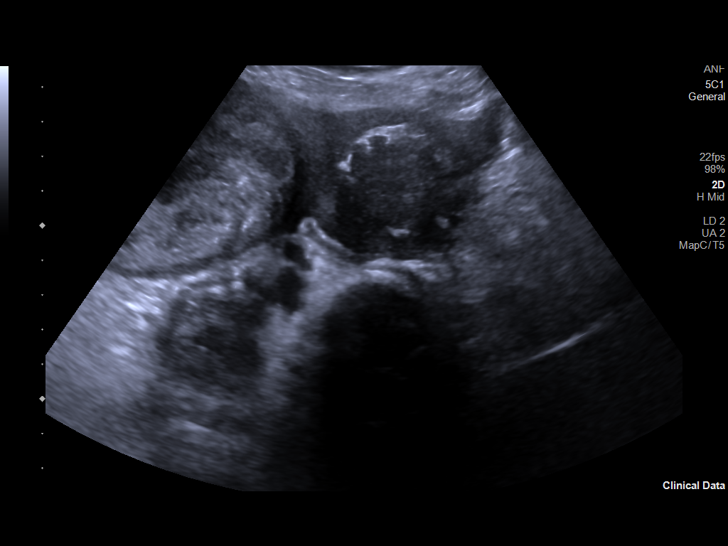
[im 24/71]
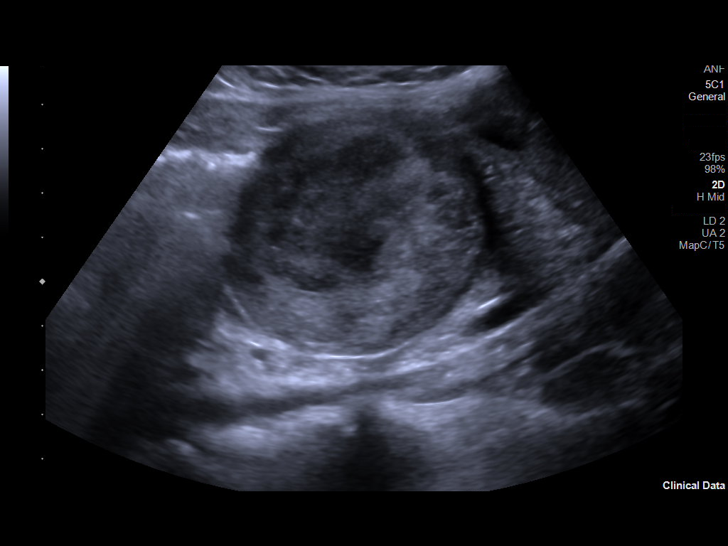
[im 27/71]
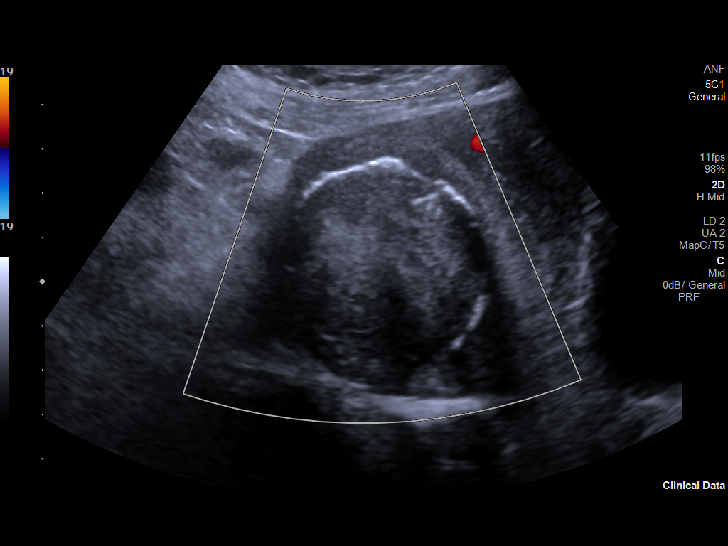
[im 33/71]
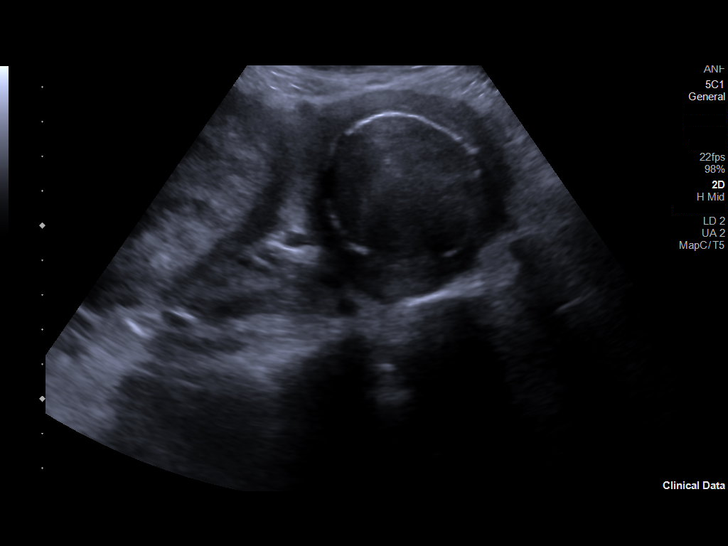
[im 38/71]
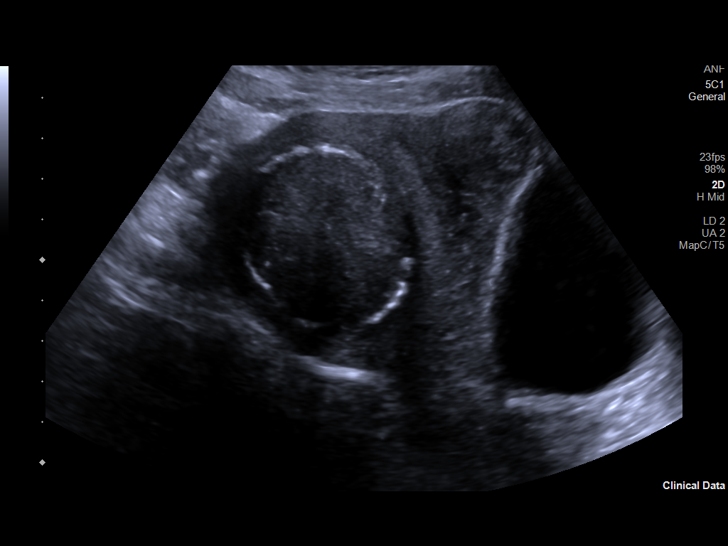
[im 44/71]
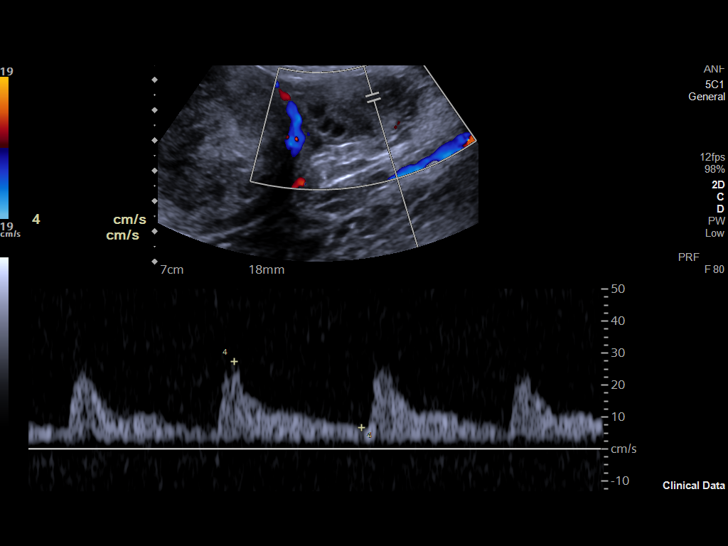
[im 47/71]
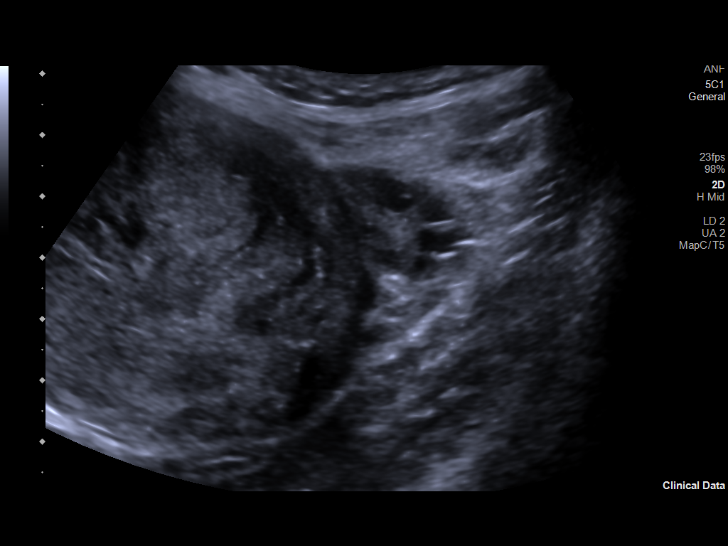
[im 53/71]
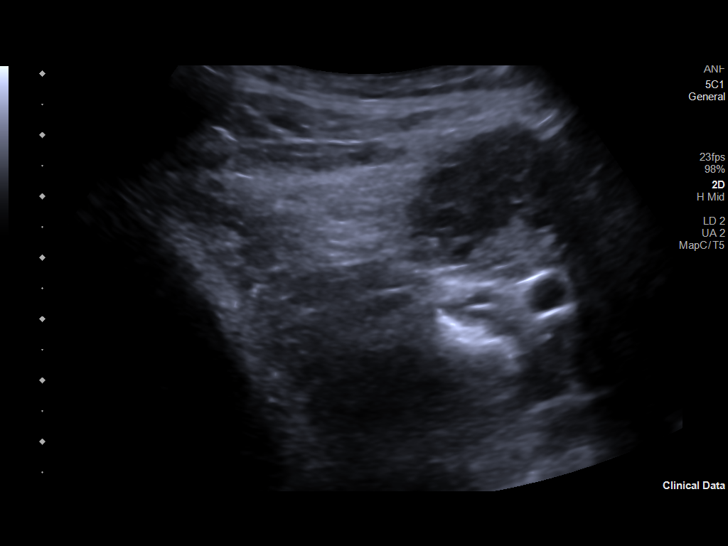
[im 59/71]
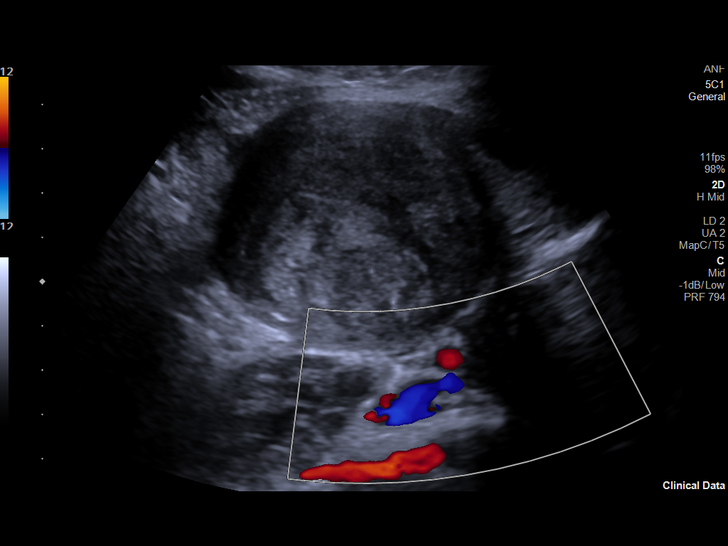
[im 65/71]
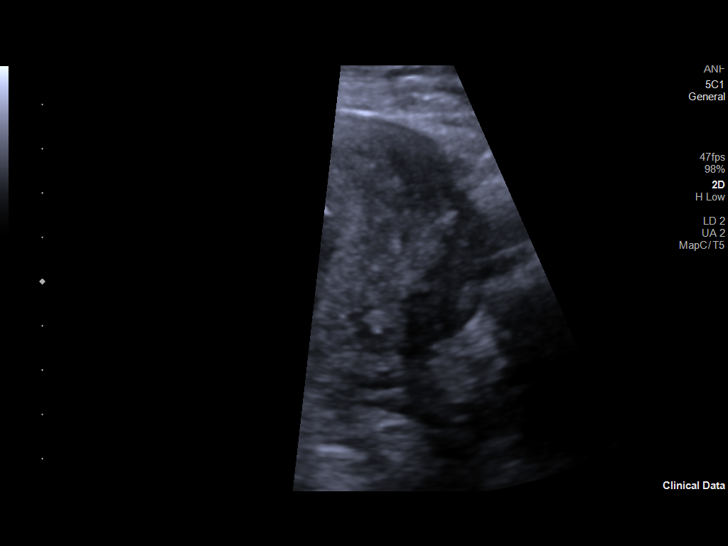
[im 71/71]
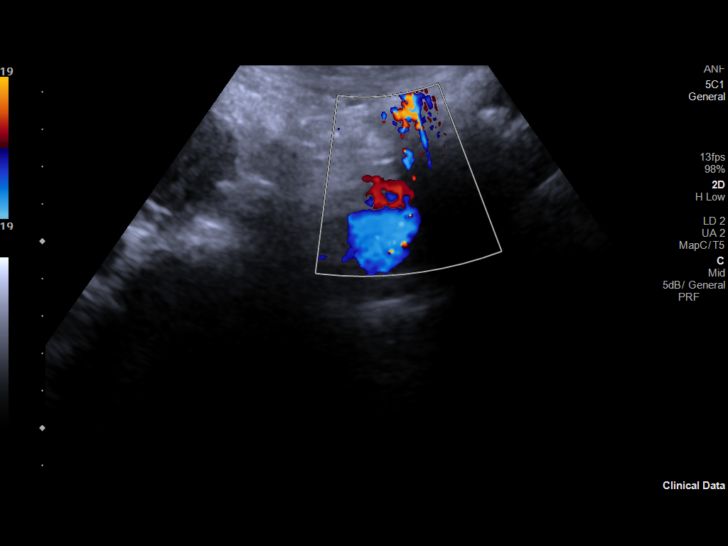

[14 of 25 positions shown; findings below may reference images not displayed]

FINDINGS: Uterus

Measurements: 11.4 x 7.8 x 6.4 = volume: 298 mL. Three fibroids are
identified:

6.6 x 5.9 x 6.5 cm exophytic

5 x 5.4 x 5.1 cm likely with a submucosal component

3.1 x 1.9 x 2.7 cm with a subserosal component

Endometrium

Thickness: 7 mm.  No focal abnormality visualized.

Right ovary

Measurements: 4.1 x 2 x 4 cm = volume: 17.3 mL. Normal appearance/no
adnexal mass.

Left ovary

Measurements: 3.3 x 1.4 x 1.6 cm = volume: 3.8 mL. Normal
appearance/no adnexal mass.

Pulsed Doppler evaluation demonstrates normal low-resistance
arterial and venous waveforms in right ovary. Suboptimal Doppler
evaluation of the left ovary due to location.

Other: Trace free fluid, likely physiologic
IMPRESSION: Myomatous uterus.

Ovaries are unremarkable.

## 2021-04-27 IMAGING — US US ART/VEN ABD/PELV/SCROTUM DOPPLER LTD
1 series · 14 of 25 positions shown · non-contrast
Comparison: None.

CLINICAL DATA: Fibroid uterus on CT

EXAM:
TRANSABDOMINAL ULTRASOUND OF PELVIS
DOPPLER ULTRASOUND OF OVARIES
TECHNIQUE: Transabdominal ultrasound examination of the pelvis was performed
including evaluation of the uterus, ovaries, adnexal regions, and
pelvic cul-de-sac.
Color and duplex Doppler ultrasound was utilized to evaluate blood
flow to the ovaries.

[Series 1: us art/ven abd/pelv/scrotum doppler ltd · 71 acquisitions, 14 frames shown]
[im 1/71]
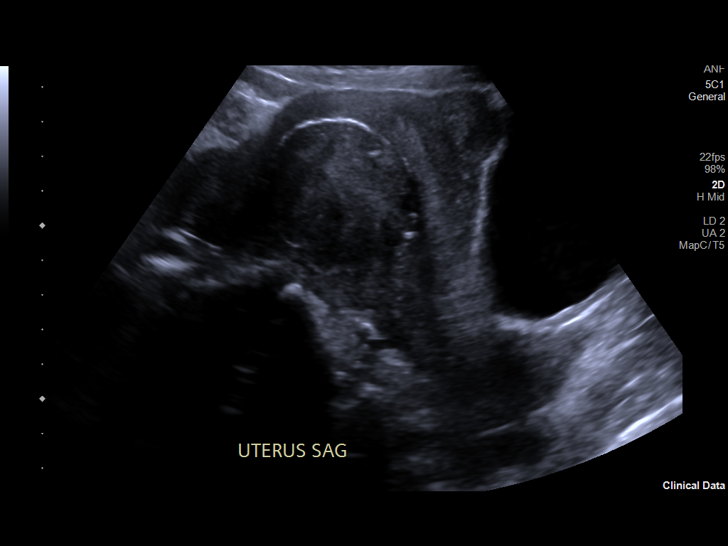
[im 6/71]
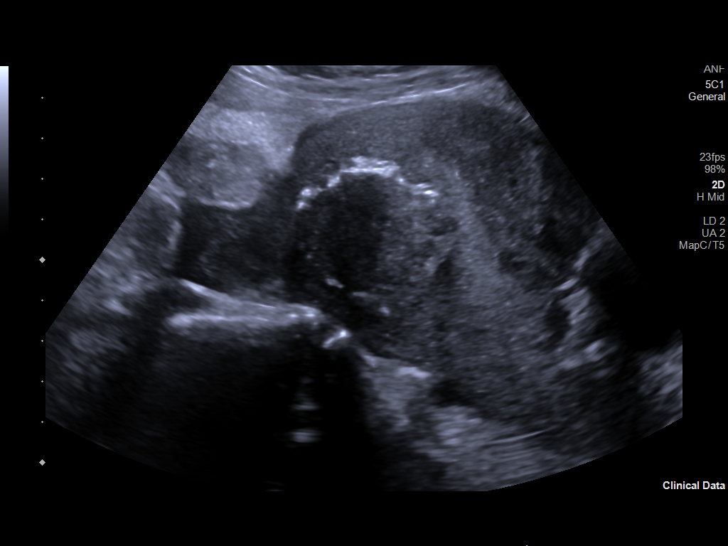
[im 12/71]
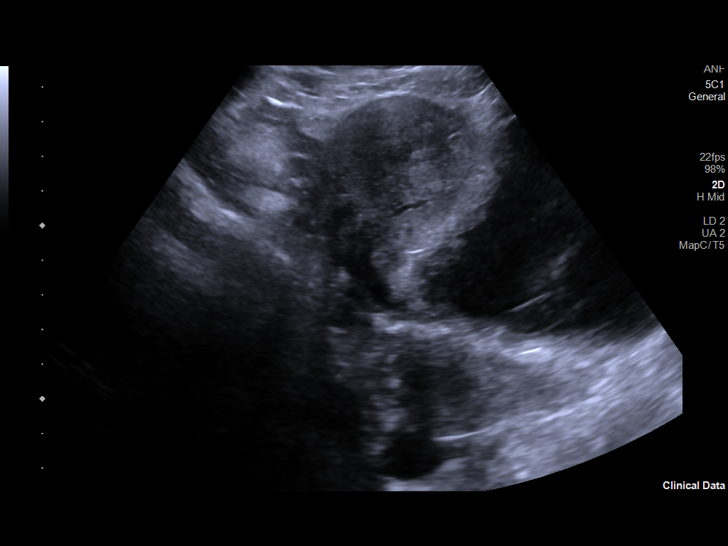
[im 18/71]
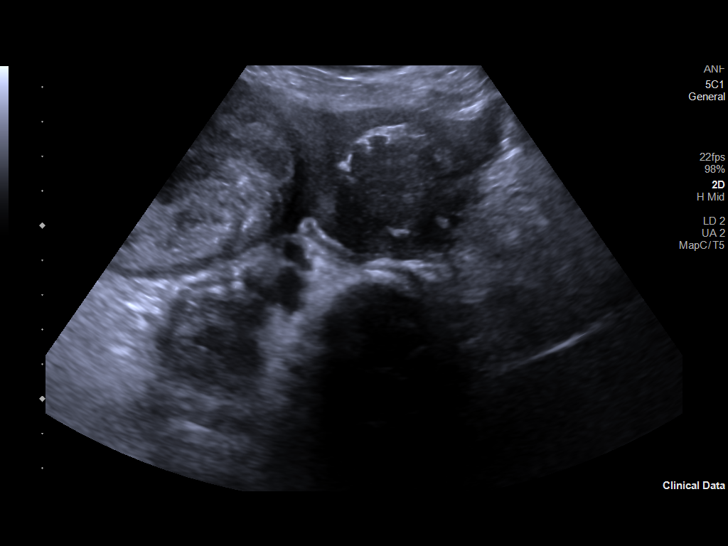
[im 24/71]
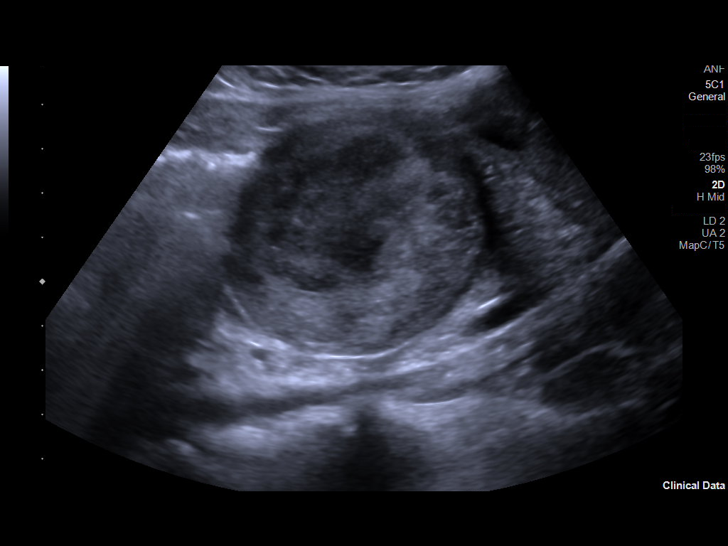
[im 27/71]
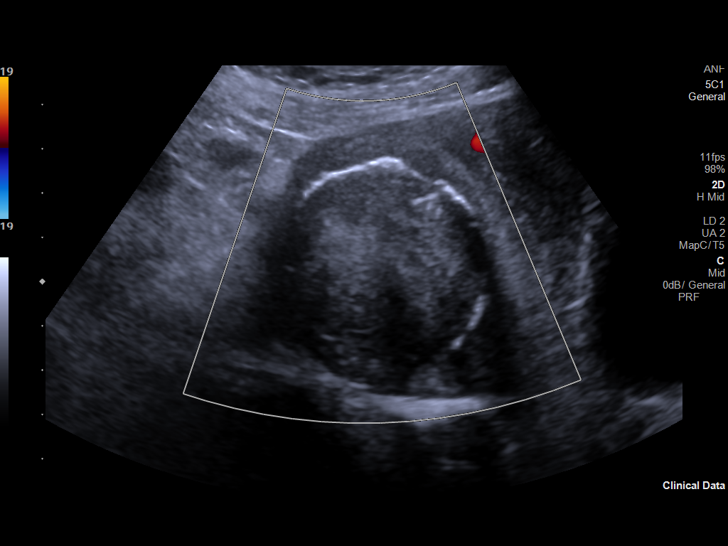
[im 33/71]
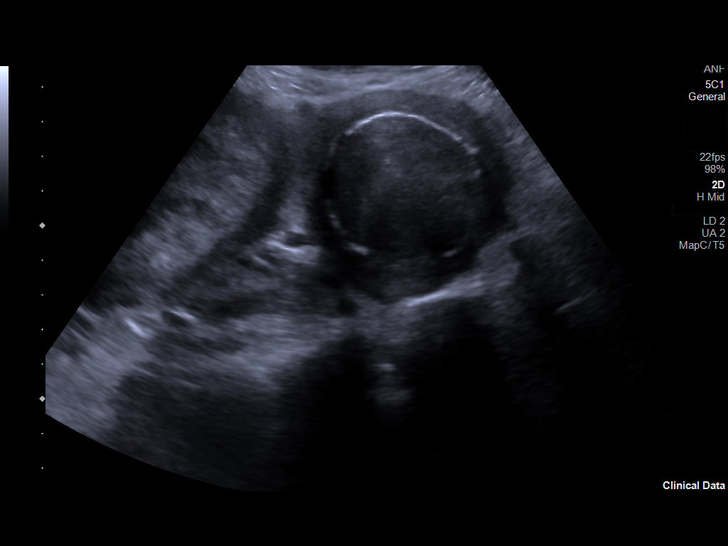
[im 38/71]
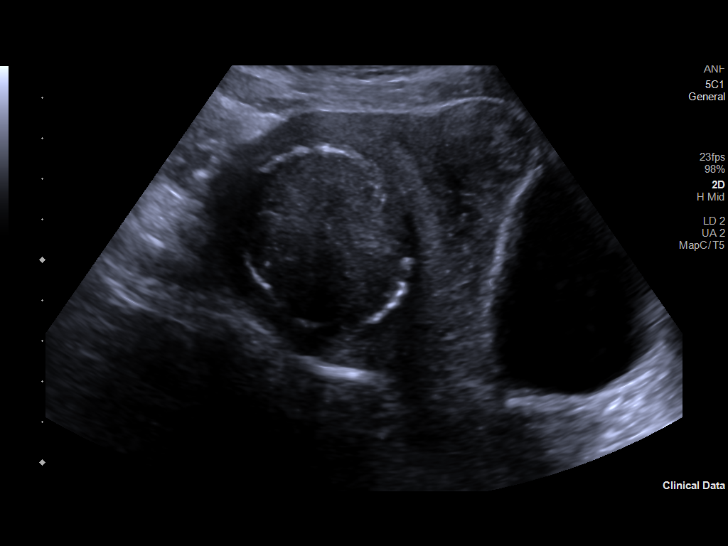
[im 44/71]
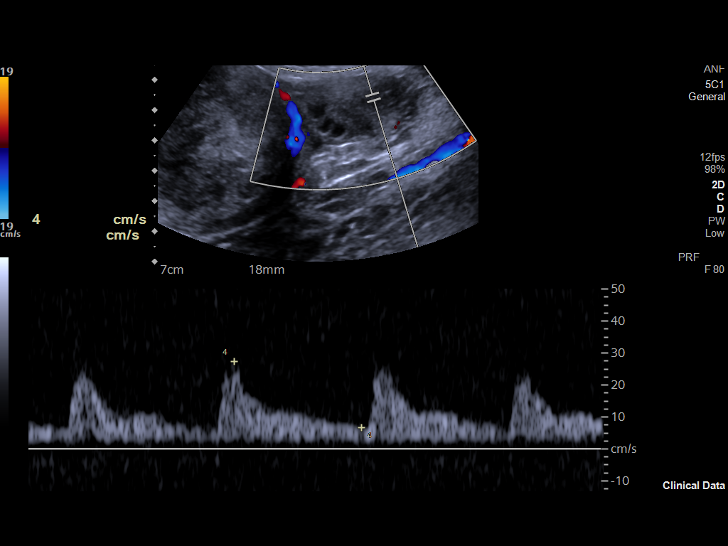
[im 47/71]
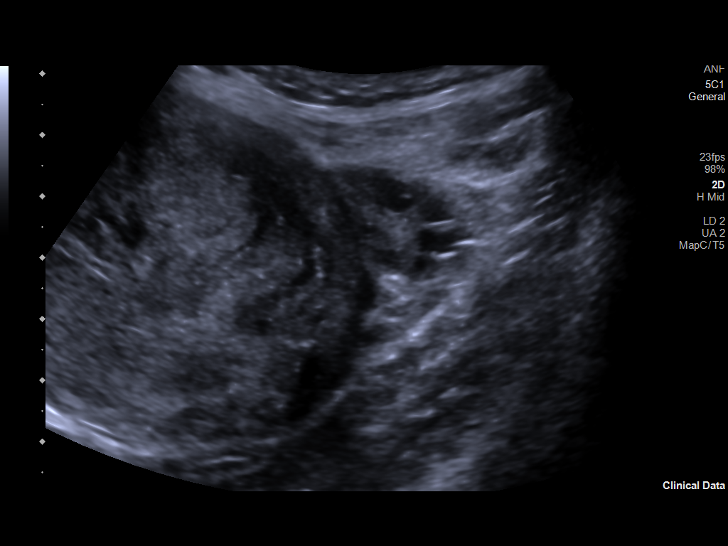
[im 53/71]
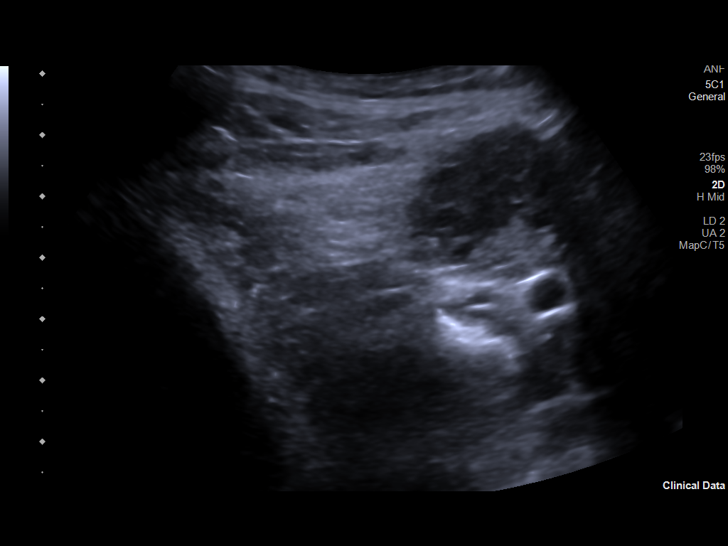
[im 59/71]
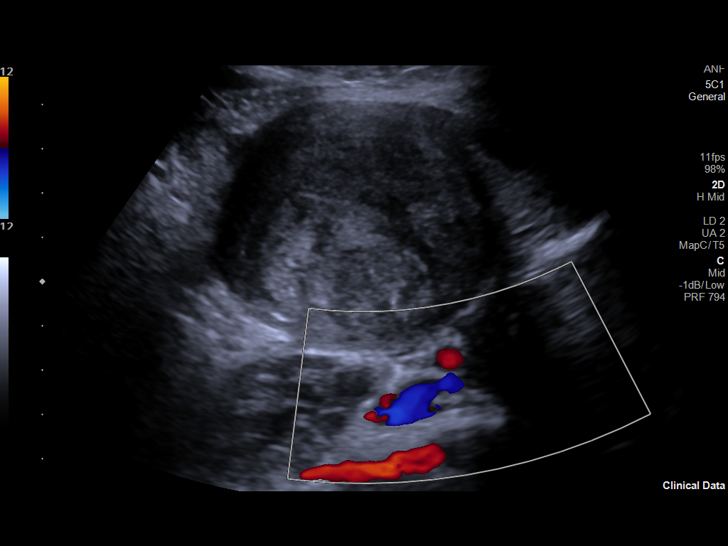
[im 65/71]
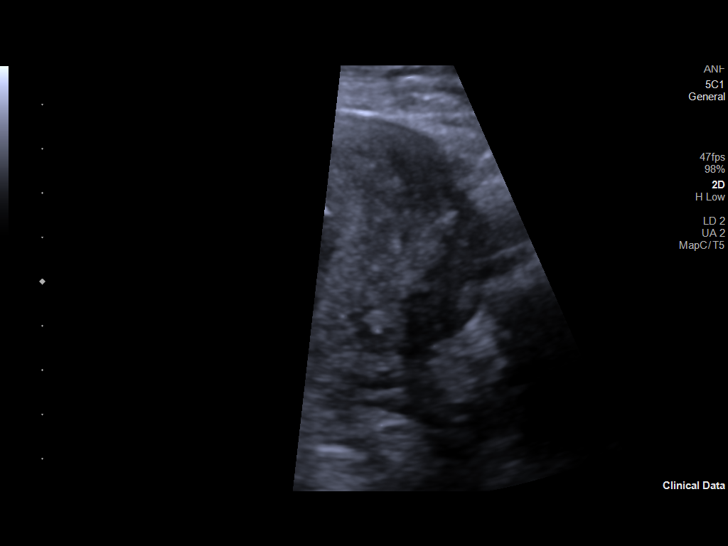
[im 71/71]
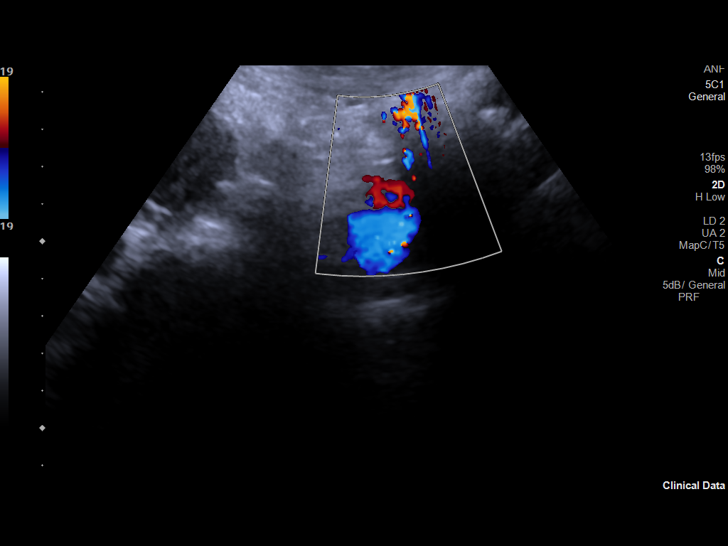

[14 of 25 positions shown; findings below may reference images not displayed]

FINDINGS: Uterus

Measurements: 11.4 x 7.8 x 6.4 = volume: 298 mL. Three fibroids are
identified:

6.6 x 5.9 x 6.5 cm exophytic

5 x 5.4 x 5.1 cm likely with a submucosal component

3.1 x 1.9 x 2.7 cm with a subserosal component

Endometrium

Thickness: 7 mm.  No focal abnormality visualized.

Right ovary

Measurements: 4.1 x 2 x 4 cm = volume: 17.3 mL. Normal appearance/no
adnexal mass.

Left ovary

Measurements: 3.3 x 1.4 x 1.6 cm = volume: 3.8 mL. Normal
appearance/no adnexal mass.

Pulsed Doppler evaluation demonstrates normal low-resistance
arterial and venous waveforms in right ovary. Suboptimal Doppler
evaluation of the left ovary due to location.

Other: Trace free fluid, likely physiologic
IMPRESSION: Myomatous uterus.

Ovaries are unremarkable.

## 2022-12-13 ENCOUNTER — Other Ambulatory Visit: Payer: Self-pay

## 2022-12-13 ENCOUNTER — Encounter (HOSPITAL_COMMUNITY): Payer: Self-pay | Admitting: Emergency Medicine

## 2022-12-13 ENCOUNTER — Emergency Department (HOSPITAL_COMMUNITY)
Admission: EM | Admit: 2022-12-13 | Discharge: 2022-12-14 | Disposition: A | Discharge status: Home / Self Care | Payer: Medicaid Other | Attending: Emergency Medicine | Admitting: Emergency Medicine

## 2022-12-13 DIAGNOSIS — N760 Acute vaginitis: Secondary | ICD-10-CM | POA: Insufficient documentation

## 2022-12-13 DIAGNOSIS — R1030 Lower abdominal pain, unspecified: Secondary | ICD-10-CM | POA: Diagnosis present

## 2022-12-13 LAB — COMPREHENSIVE METABOLIC PANEL
ALT: 9 U/L (ref 0–44)
AST: 18 U/L (ref 15–41)
Albumin: 3.7 g/dL (ref 3.5–5.0)
Alkaline Phosphatase: 42 U/L (ref 38–126)
Anion gap: 8 (ref 5–15)
BUN: 10 mg/dL (ref 6–20)
CO2: 25 mmol/L (ref 22–32)
Calcium: 8.7 mg/dL — ABNORMAL LOW (ref 8.9–10.3)
Chloride: 105 mmol/L (ref 98–111)
Creatinine, Ser: 0.77 mg/dL (ref 0.44–1.00)
GFR, Estimated: >60 mL/min (ref >60)
Glucose, Bld: 95 mg/dL (ref 70–99)
Potassium: 4.2 mmol/L (ref 3.5–5.1)
Sodium: 138 mmol/L (ref 135–145)
Total Bilirubin: 0.6 mg/dL (ref 0.3–1.2)
Total Protein: 6.4 g/dL — ABNORMAL LOW (ref 6.5–8.1)

## 2022-12-13 LAB — URINALYSIS, ROUTINE W REFLEX MICROSCOPIC
Bilirubin Urine: NEGATIVE
Glucose, UA: NEGATIVE mg/dL
Ketones, ur: NEGATIVE mg/dL
Nitrite: NEGATIVE
Protein, ur: NEGATIVE mg/dL
Specific Gravity, Urine: 1.009 (ref 1.005–1.030)
pH: 6 (ref 5.0–8.0)

## 2022-12-13 LAB — CBC
HCT: 39.6 % (ref 36.0–46.0)
Hemoglobin: 13 g/dL (ref 12.0–15.0)
MCH: 29.4 pg (ref 26.0–34.0)
MCHC: 32.8 g/dL (ref 30.0–36.0)
MCV: 89.6 fL (ref 80.0–100.0)
Platelets: 197 10*3/uL (ref 150–400)
RBC: 4.42 MIL/uL (ref 3.87–5.11)
RDW: 12.7 % (ref 11.5–15.5)
WBC: 7.9 10*3/uL (ref 4.0–10.5)
nRBC: 0 % (ref 0.0–0.2)

## 2022-12-13 LAB — LIPASE, BLOOD: Lipase: 46 U/L (ref 11–51)

## 2022-12-13 LAB — HCG, SERUM, QUALITATIVE: Preg, Serum: NEGATIVE

## 2022-12-13 NOTE — ED Triage Notes (Signed)
Patient coming to ED for evaluation of suprapubic abdominal pain.  Reports symptoms started on Thursday.  No reports of vaginal bleeding or discharge.  No changes in urine color.  Has sharp pain with urination.  No fevers, nausea, or vomiting.  States pain is constant

## 2022-12-14 LAB — GC/CHLAMYDIA PROBE AMP (~~LOC~~) NOT AT ARMC
Chlamydia: NEGATIVE
Comment: NEGATIVE
Comment: NORMAL
Neisseria Gonorrhea: NEGATIVE

## 2022-12-14 LAB — WET PREP, GENITAL
Sperm: NONE SEEN
Trich, Wet Prep: NONE SEEN
WBC, Wet Prep HPF POC: <10 (ref <10)
Yeast Wet Prep HPF POC: NONE SEEN

## 2022-12-14 MED ORDER — METRONIDAZOLE 500 MG PO TABS: 500.0000 mg | ORAL_TABLET | Freq: Two times a day (BID) | ORAL | 0 refills | Status: DC

## 2022-12-14 NOTE — ED Provider Notes (Signed)
[facility] Provider Note   CSN: [id number] Arrival date & time: 12/13/22  2153     History  Chief Complaint  Patient presents with   Abdominal Pain    Brenda Manning is a 27 y.o. female.  The history is provided by the patient and medical records.  Abdominal Pain  27 year old female presenting to the ED with suprapubic abdominal pain.  Reports this began on Thursday, 5 days ago.  Reports pain has been fairly constant since that time.  She reports she has abdominal "pressure" when urinating but denies any dysuria or hematuria.  She has not had any vaginal discharge.  No expressed concern for STD.  States she did have 2 menstrual cycles last month which is somewhat abnormal for her, however bleeding was fairly normal and not heavier than usual.  No passage of clots.  She is no longer bleeding.  Has hx of uterine fibroids-- previously saw GYN about this but not since 2021.  No hx ovarian cysts.  Home Medications Prior to Admission medications   Medication Sig Start Date End Date Taking? Authorizing Provider  metroNIDAZOLE (FLAGYL) 500 MG tablet Take 1 tablet (500 mg total) by mouth 2 (two) times daily. 12/14/22  Yes Garlon Hatchet, PA-C  ibuprofen (ADVIL) 800 MG tablet Take 1 tablet (800 mg total) by mouth every 8 (eight) hours as needed. 08/16/19   Mayers, Cari S, PA-C  norgestimate-ethinyl estradiol (ESTARYLLA) 0.25-35 MG-MCG tablet Take 1 tablet by mouth daily. 07/15/20   Reva Bores, MD      Allergies    Patient has no known allergies.    Review of Systems   Review of Systems  Gastrointestinal:  Positive for abdominal pain.  All other systems reviewed and are negative.   Physical Exam Updated Vital Signs BP 111/74   Pulse 61   Temp 98.2 F (36.8 C)   Resp 16   Ht 5\' 2"  (1.575 m)   Wt 53.1 kg   LMP 11/25/2022   SpO2 99%   BMI 21.40 kg/m   Physical Exam Vitals and nursing note reviewed.  Constitutional:       Appearance: She is well-developed.  HENT:     Head: Normocephalic and atraumatic.  Eyes:     Conjunctiva/sclera: Conjunctivae normal.     Pupils: Pupils are equal, round, and reactive to light.  Cardiovascular:     Rate and Rhythm: Normal rate and regular rhythm.     Heart sounds: Normal heart sounds.  Pulmonary:     Effort: Pulmonary effort is normal.     Breath sounds: Normal breath sounds.  Abdominal:     General: Bowel sounds are normal.     Palpations: Abdomen is soft.     Tenderness: There is abdominal tenderness in the suprapubic area.  Genitourinary:    Comments: Exam chaperoned by RN Amil Amen Normal external genitalia without lesions/rash, some white vaginal discharge present around introitus and in the vaginal canal, cervix appears normal, no CMT, no adnexal masses/tenderness noted Musculoskeletal:        General: Normal range of motion.     Cervical back: Normal range of motion.  Skin:    General: Skin is warm and dry.  Neurological:     Mental Status: She is alert and oriented to person, place, and time.     ED Results / Procedures / Treatments   Labs (all labs ordered are listed, but only abnormal results are displayed) Labs Reviewed  WET PREP, GENITAL - Abnormal; Notable for the following components:      Result Value   Clue Cells Wet Prep HPF POC PRESENT (*)    All other components within normal limits  COMPREHENSIVE METABOLIC PANEL - Abnormal; Notable for the following components:   Calcium 8.7 (*)    Total Protein 6.4 (*)    All other components within normal limits  URINALYSIS, ROUTINE W REFLEX MICROSCOPIC - Abnormal; Notable for the following components:   APPearance HAZY (*)    Hgb urine dipstick SMALL (*)    Leukocytes,Ua TRACE (*)    Bacteria, UA RARE (*)    All other components within normal limits  LIPASE, BLOOD  CBC  HCG, SERUM, QUALITATIVE  GC/CHLAMYDIA PROBE AMP ([place]) NOT AT [facility]    EKG None  Radiology No results  found.  Procedures Procedures    Medications Ordered in ED Medications - No data to display  ED Course/ Medical Decision Making/ A&P                                 Medical Decision Making Amount and/or Complexity of Data Reviewed Labs: ordered. ECG/medicine tests: ordered and independent interpretation performed.  Risk Prescription drug management.   27 year old female presenting to the ED with suprapubic abdominal pain for the past 5 days.  She denies any vaginal discharge or concern for STD.  She is afebrile and nontoxic in appearance.  She does have some mild suprapubic tenderness on exam.  No peritoneal signs.  Labs are obtained from triage and are overall reassuring--no leukocytosis or electrolyte derangement.  Normal lipase.  UA appears contaminated with 11-20 squamous cells, rare bacteria.  Pregnancy test is negative.  Pelvic performed, does have some white vaginal discharge present.  No adnexal or CMT.  Wet prep + for clue cells.  Gc/chl pending but no expressed concern for STD.  Will treat with course of flagyl.  Will have her follow-up with OB-GYN.  Can return here for any new/acute changes.  Final Clinical Impression(s) / ED Diagnoses Final diagnoses:  Bacterial vaginosis    Rx / DC Orders ED Discharge Orders          Ordered    metroNIDAZOLE (FLAGYL) 500 MG tablet  2 times daily        12/14/22 0552              Garlon Hatchet, PA-C 12/14/22 7829    Shon Baton, MD 12/21/22 [phone]

## 2022-12-14 NOTE — Discharge Instructions (Addendum)
Take the prescribed medication as directed.  Do not drink alcohol while taking this, it will make you sick. Follow-up with OB-GYN-- can call for appt. Return to the ED for new or worsening symptoms.

## 2023-02-01 ENCOUNTER — Ambulatory Visit: Payer: Medicaid Other | Admitting: Obstetrics and Gynecology

## 2023-03-22 ENCOUNTER — Other Ambulatory Visit: Payer: Self-pay

## 2023-03-22 ENCOUNTER — Encounter: Payer: Self-pay | Admitting: Obstetrics and Gynecology

## 2023-03-22 ENCOUNTER — Other Ambulatory Visit (HOSPITAL_COMMUNITY)
Admission: RE | Admit: 2023-03-22 | Discharge: 2023-03-22 | Disposition: A | Discharge status: Home / Self Care | Payer: Medicaid Other | Source: Ambulatory Visit | Attending: Obstetrics and Gynecology | Admitting: Obstetrics and Gynecology

## 2023-03-22 ENCOUNTER — Ambulatory Visit: Payer: Medicaid Other | Admitting: Obstetrics and Gynecology

## 2023-03-22 VITALS — BP 117/72 | HR 71 | Wt 131.8 lb

## 2023-03-22 DIAGNOSIS — Z01419 Encounter for gynecological examination (general) (routine) without abnormal findings: Secondary | ICD-10-CM | POA: Diagnosis present

## 2023-03-22 DIAGNOSIS — Z124 Encounter for screening for malignant neoplasm of cervix: Secondary | ICD-10-CM

## 2023-03-22 DIAGNOSIS — Z113 Encounter for screening for infections with a predominantly sexual mode of transmission: Secondary | ICD-10-CM | POA: Insufficient documentation

## 2023-03-22 DIAGNOSIS — Z1151 Encounter for screening for human papillomavirus (HPV): Secondary | ICD-10-CM

## 2023-03-22 LAB — POCT PREGNANCY, URINE: Preg Test, Ur: NEGATIVE

## 2023-03-22 NOTE — Progress Notes (Signed)
ANNUAL EXAM Patient name: Brenda Manning MRN [id number]  Date of birth: [birth year] Chief Complaint:   Gynecologic Exam  History of Present Illness:   Brenda Manning is a 27 y.o. G1P0010 being seen today for a routine annual exam.  Current complaints: annual  Menstrual concerns? Yes  previously irregular, will miss 2-3 months of menses in a year and told it was due to her fibroids Breast or nipple changes? No  Contraception use? No no intention of pregnancy  Sexually active? Yes female partner, occasional pain with intercourse that is relieved with position changes  Patient's last menstrual period was 03/22/2023 (exact date).   The pregnancy intention screening data noted above was reviewed. Potential methods of contraception were discussed. The patient elected to proceed with No data recorded.   Last pap     Component Value Date/Time   DIAGPAP  09/06/2019 1118    - Negative for intraepithelial lesion or malignancy (NILM)   ADEQPAP  09/06/2019 1118    Satisfactory for evaluation; transformation zone component PRESENT.   Last mammogram: n/a.  Last colonoscopy: n/a.      09/06/2019   11:09 AM  Depression screen PHQ 2/9  Decreased Interest 3  Down, Depressed, Hopeless 1  PHQ - 2 Score 4  Altered sleeping 2  Tired, decreased energy 3  Change in appetite 1  Feeling bad or failure about yourself  0  Trouble concentrating 0  Moving slowly or fidgety/restless 1  Suicidal thoughts 0  PHQ-9 Score 11        09/06/2019   11:10 AM  GAD 7 : Generalized Anxiety Score  Nervous, Anxious, on Edge 0  Control/stop worrying 1  Worry too much - different things 0  Trouble relaxing 2  Restless 0  Easily annoyed or irritable 1  Afraid - awful might happen 2  Total GAD 7 Score 6     Review of Systems:   Pertinent items are noted in HPI Denies any headaches, blurred vision, fatigue, shortness of breath, chest pain, abdominal pain, abnormal vaginal  discharge/itching/odor/irritation, problems with periods, bowel movements, urination, or intercourse unless otherwise stated above. Pertinent History Reviewed:  Reviewed past medical,surgical, social and family history.  Reviewed problem list, medications and allergies. Physical Assessment:   Vitals:   03/22/23 0950  BP: 117/72  Pulse: 71  Weight: 131 lb 12.8 oz (59.8 kg)  Body mass index is 24.11 kg/m.        Physical Examination:   General appearance - well appearing, and in no distress  Mental status - alert, oriented to person, place, and time  Psych:  She has a normal mood and affect  Skin - warm and dry, normal color, no suspicious lesions noted  Chest - effort normal, all lung fields clear to auscultation bilaterally  Heart - normal rate and regular rhythm  Breasts - breasts appear normal, no suspicious masses, no skin or nipple changes or  axillary nodes  Abdomen - soft, nontender, nondistended, no masses or organomegaly  Pelvic -  VULVA: normal appearing vulva with no masses, tenderness or lesions   VAGINA: normal appearing vagina with normal color and discharge, no lesions   CERVIX: normal appearing cervix without discharge or lesions, no CMT  Thin prep pap is done with reflex HR HPV cotesting  UTERUS: uterus is felt to be normal size, shape, consistency and nontender   ADNEXA: No adnexal masses or tenderness noted.  Extremities:  No swelling or varicosities noted  Chaperone present for exam  Results for orders placed or performed in visit on 03/22/23 (from the past 24 hours)  Pregnancy, urine POC   Collection Time: 03/22/23 10:05 AM  Result Value Ref Range   Preg Test, Ur NEGATIVE NEGATIVE      Assessment & Plan:  1. Well woman exam with routine gynecological exam (Primary) - Cervical cancer screening: Discussed screening Q3 years. Reviewed importance of annual exams and limits of pap smear. Pap with reflex HPV collected - GC/CT: Discussed and recommended. Pt   accepts - Birth Control:  none - Breast Health: Encouraged self breast awareness/exams.  - noted fibroids typically cause heavy periods but not necessarily missed menses; can resume OCPs for regular menses - Follow-up: 12 months and prn  - Cytology - PAP - Pregnancy, urine POC  2. Screening examination for STI - Cytology - PAP - Hepatitis B surface antigen - Hepatitis C antibody - HIV antibody (with reflex) - RPR  3. Screening for cervical cancer Pap collected - Cytology - PAP    Orders Placed This Encounter  Procedures   Pregnancy, urine POC    Meds:  Meds ordered this encounter  Medications   norgestimate-ethinyl estradiol (ESTARYLLA) 0.25-35 MG-MCG tablet    Sig: Take 1 tablet by mouth daily.    Dispense:  84 tablet    Refill:  3    **Patient requests 90 days supply**    Follow-up: No follow-ups on file.  Lorriane Shire, MD 03/22/2023 10:32 AM

## 2023-03-23 LAB — HIV ANTIBODY (ROUTINE TESTING W REFLEX): HIV Screen 4th Generation wRfx: NONREACTIVE

## 2023-03-23 LAB — HEPATITIS B SURFACE ANTIGEN: Hepatitis B Surface Ag: NEGATIVE

## 2023-03-23 LAB — RPR: RPR Ser Ql: NONREACTIVE

## 2023-03-23 LAB — HEPATITIS C ANTIBODY: Hep C Virus Ab: NONREACTIVE

## 2023-03-25 LAB — CYTOLOGY - PAP
Chlamydia: NEGATIVE
Comment: NEGATIVE
Comment: NEGATIVE
Comment: NORMAL
Diagnosis: NEGATIVE
Neisseria Gonorrhea: NEGATIVE
Trichomonas: NEGATIVE

## 2023-03-28 MED ORDER — NORGESTIMATE-ETH ESTRADIOL 0.25-35 MG-MCG PO TABS
1.0000 | ORAL_TABLET | Freq: Every day | ORAL | 3 refills | Status: AC
Start: 2023-03-28 — End: ?

## 2023-11-21 ENCOUNTER — Other Ambulatory Visit: Payer: Self-pay

## 2023-11-21 ENCOUNTER — Encounter: Payer: Self-pay | Admitting: Obstetrics and Gynecology

## 2023-11-21 ENCOUNTER — Ambulatory Visit: Admitting: Obstetrics and Gynecology

## 2023-11-21 VITALS — BP 111/73 | HR 80 | Wt 133.0 lb

## 2023-11-21 DIAGNOSIS — Z1331 Encounter for screening for depression: Secondary | ICD-10-CM | POA: Diagnosis not present

## 2023-11-21 DIAGNOSIS — D219 Benign neoplasm of connective and other soft tissue, unspecified: Secondary | ICD-10-CM

## 2023-11-21 NOTE — Patient Instructions (Addendum)
 Do they cover natera genetic testing for sickle or just general sickle cell testing in general ?

## 2023-11-21 NOTE — Progress Notes (Signed)
 GYNECOLOGY VISIT  Patient name: Brenda Manning MRN [id number]  Date of birth: Dec 14, 1995 Chief Complaint:   Fibroids   History:  Brenda Manning is a 28 y.o. G1P0010 being seen today for concern for increased fibroids and genotype testing.   Discussed the use of AI scribe software for clinical note transcription with the patient, who gave verbal consent to proceed.  History of Present Illness Brenda Manning is a 28 year old female with fibroids who presents with abdominal distension and menstrual changes.  Over the past two months, she has experienced an increase in abdominal size, particularly on the right side, along with a sensation of movement. No hard masses have been detected. She reports no changes in bowel or urinary habits, and her weight has remained stable.  Her menstrual cycles last four days with varying flow intensity. She experiences increased pain during menstruation, although the bleeding has not intensified. A previous ultrasound in 2021 identified an exophytic fibroid measuring approximately 5-6 cm.  She is sexually active. She has no history of surgeries and has not experienced any issues with her current contraceptive pills.  She inquired about testing for sickle cell carrier status.     Past Medical History:  Diagnosis Date   Fibroids     No past surgical history on file.  The following portions of the patient's history were reviewed and updated as appropriate: allergies, current medications, past family history, past medical history, past social history, past surgical history and problem list.   Health Maintenance:   Last pap     Component Value Date/Time   DIAGPAP  03/22/2023 1048    - Negative for intraepithelial lesion or malignancy (NILM)   DIAGPAP  09/06/2019 1118    - Negative for intraepithelial lesion or malignancy (NILM)   ADEQPAP  03/22/2023 1048    Satisfactory for evaluation; transformation zone component PRESENT.   ADEQPAP   09/06/2019 1118    Satisfactory for evaluation; transformation zone component PRESENT.   Last mammogram: n/a   Review of Systems:  Pertinent items are noted in HPI. Comprehensive review of systems was otherwise negative.   Objective:  Physical Exam BP 111/73   Pulse 80   Wt 133 lb (60.3 kg)   LMP 11/10/2023   BMI 24.33 kg/m    Physical Exam Vitals and nursing note reviewed.  Constitutional:      Appearance: Normal appearance.  HENT:     Head: Normocephalic and atraumatic.  Pulmonary:     Effort: Pulmonary effort is normal.  Abdominal:     Comments: Mobile, ~7cm mass (suspect fibroid) in RLQ  Skin:    General: Skin is warm and dry.  Neurological:     General: No focal deficit present.     Mental Status: She is alert.  Psychiatric:        Mood and Affect: Mood normal.        Behavior: Behavior normal.        Thought Content: Thought content normal.        Judgment: Judgment normal.        Assessment & Plan:   Assessment & Plan Uterine fibroids (exophytic, submucosal, subserosal) Increased abdominal size and pain suggest exophytic fibroid causing bulk symptoms. Bleeding pattern unchanged, minimal impact on uterine lining. Differential includes submucosal and subserosal fibroids. - Order pelvic ultrasound to assess fibroid size and location. - Schedule follow-up to discuss ultrasound results and treatment options. - Discussed treatment options: myomectomy, uterine artery embolization, hysterectomy. Uterine artery embolization  not recommended if future pregnancy desired.   Carter Quarry, MD Minimally Invasive Gynecologic Surgery [facility] for [facility], [facility]

## 2023-11-23 ENCOUNTER — Ambulatory Visit (HOSPITAL_COMMUNITY)
Admission: RE | Admit: 2023-11-23 | Discharge: 2023-11-23 | Disposition: A | Discharge status: Home / Self Care | Source: Ambulatory Visit | Attending: Obstetrics and Gynecology | Admitting: Obstetrics and Gynecology

## 2023-11-23 ENCOUNTER — Encounter (HOSPITAL_COMMUNITY): Payer: Self-pay

## 2023-11-23 DIAGNOSIS — D219 Benign neoplasm of connective and other soft tissue, unspecified: Secondary | ICD-10-CM | POA: Diagnosis present

## 2023-12-06 ENCOUNTER — Ambulatory Visit: Payer: Self-pay | Admitting: Obstetrics and Gynecology

## 2023-12-26 ENCOUNTER — Telehealth: Admitting: Obstetrics and Gynecology

## 2023-12-26 ENCOUNTER — Encounter: Payer: Self-pay | Admitting: Obstetrics and Gynecology

## 2023-12-26 DIAGNOSIS — D219 Benign neoplasm of connective and other soft tissue, unspecified: Secondary | ICD-10-CM

## 2023-12-26 NOTE — Progress Notes (Signed)
    GYNECOLOGY VIRTUAL VISIT ENCOUNTER NOTE  Provider location: Center for [facility]   Patient location: Home  I connected with Brenda Manning on 12/26/23 at  4:15 PM EDT by MyChart Video Encounter and verified that I am speaking with the correct person using two identifiers.   I discussed the limitations, risks, security and privacy concerns of performing an evaluation and management service virtually and the availability of in person appointments. I also discussed with the patient that there may be a patient responsible charge related to this service. The patient expressed understanding and agreed to proceed.   History:  Brenda Manning is a 28 y.o. G61P0010 female being evaluated today for follow up of US  results. Bleeding is normal - previously prescribed medication is helping. Reiterates that she feels that her fibroids have increased. Would like to conceive in the future and interested in possible removal but concerned about affect on fertility.   No new CHC contraindications   Past Medical History:  Diagnosis Date   Fibroids    No past surgical history on file. The following portions of the patient's history were reviewed and updated as appropriate: allergies, current medications, past family history, past medical history, past social history, past surgical history and problem list.   Health Maintenance:      Component Value Date/Time   DIAGPAP  03/22/2023 1048    - Negative for intraepithelial lesion or malignancy (NILM)   DIAGPAP  09/06/2019 1118    - Negative for intraepithelial lesion or malignancy (NILM)   ADEQPAP  03/22/2023 1048    Satisfactory for evaluation; transformation zone component PRESENT.   ADEQPAP  09/06/2019 1118    Satisfactory for evaluation; transformation zone component PRESENT.     Review of Systems:  Pertinent items noted in HPI and remainder of comprehensive ROS otherwise negative.  Physical Exam:    General:  Alert, oriented and cooperative. Patient appears to be in no acute distress.  Mental Status: Normal mood and affect. Normal behavior. Normal judgment and thought content.   Respiratory: Normal respiratory effort, no problems with respiration noted  Rest of physical exam deferred due to type of encounter  Labs and Imaging  No results found.     Assessment and Plan:     1. Fibroid (Primary) Will contact radiology and see if they see something more superior and if not, then will get MRI for further evaluation as on personal review of imaging there appears to be an additional, larger fibroid that correlates with findings from imaging studies in 2021.       I discussed the assessment and treatment plan with the patient. The patient was provided an opportunity to ask questions and all were answered. The patient agreed with the plan and demonstrated an understanding of the instructions.   The patient was advised to call back or seek an in-person evaluation/go to the ED if the symptoms worsen or if the condition fails to improve as anticipated.  I provided 10 minutes of face-to-face time during this encounter. I also spent 5 minutes dedicated to the care of this patient including pre-visit review of records, post visit ordering of medications and appropriate tests or procedures, coordinating care and documenting this visit encounter.    Carter Quarry, MD Center for [employer], [facility]

## 2024-01-13 ENCOUNTER — Encounter: Payer: Self-pay | Admitting: Obstetrics and Gynecology

## 2024-02-16 ENCOUNTER — Ambulatory Visit: Admitting: Obstetrics and Gynecology

## 2024-03-15 ENCOUNTER — Ambulatory Visit: Admitting: Obstetrics and Gynecology

## 2024-03-15 ENCOUNTER — Other Ambulatory Visit: Payer: Self-pay

## 2024-03-15 ENCOUNTER — Encounter: Payer: Self-pay | Admitting: Obstetrics and Gynecology

## 2024-03-15 VITALS — BP 118/75 | HR 76 | Wt 139.8 lb

## 2024-03-15 DIAGNOSIS — Z113 Encounter for screening for infections with a predominantly sexual mode of transmission: Secondary | ICD-10-CM

## 2024-03-15 DIAGNOSIS — D219 Benign neoplasm of connective and other soft tissue, unspecified: Secondary | ICD-10-CM

## 2024-03-15 DIAGNOSIS — N939 Abnormal uterine and vaginal bleeding, unspecified: Secondary | ICD-10-CM | POA: Diagnosis not present

## 2024-03-15 MED ORDER — NORETHINDRONE ACET-ETHINYL EST 1.5-30 MG-MCG PO TABS: 1.0000 | ORAL_TABLET | Freq: Every day | ORAL | 11 refills | Status: AC

## 2024-03-15 NOTE — Progress Notes (Signed)
 "   GYNECOLOGY VISIT  Patient name: Brenda Manning MRN [id number]  Date of birth: 06/15/1995 Chief Complaint:   Gynecologic Exam   History:  Brenda Manning  here for fibroid follow up. Has been taking OCP but having headaches. Currently sexually active. Would like to discuss fibroid management further.     The following portions of the patient's history were reviewed and updated as appropriate: allergies, current medications, past family history, past medical history, past social history, past surgical history and problem list.   Health Maintenance:   Last pap     Component Value Date/Time   DIAGPAP  03/22/2023 1048    - Negative for intraepithelial lesion or malignancy (NILM)   DIAGPAP  09/06/2019 1118    - Negative for intraepithelial lesion or malignancy (NILM)   ADEQPAP  03/22/2023 1048    Satisfactory for evaluation; transformation zone component PRESENT.   ADEQPAP  09/06/2019 1118    Satisfactory for evaluation; transformation zone component PRESENT.    Health Maintenance  Topic Date Due   DTaP/Tdap/Td vaccine (1 - Tdap) Never done   Hepatitis B Vaccine (1 of 3 - 19+ 3-dose series) Never done   HPV Vaccine (1 - 3-dose SCDM series) Never done   Flu Shot  Never done   COVID-19 Vaccine (2 - 2025-26 season) 12/05/2023   Pap Smear  03/21/2026   Hepatitis C Screening  Completed   HIV Screening  Completed   Pneumococcal Vaccine  Aged Out   Meningitis B Vaccine  Aged Out      Review of Systems:  Pertinent items are noted in HPI. Comprehensive review of systems was otherwise negative.   Objective:  Physical Exam BP 118/75   Pulse 76   Wt 139 lb 12.8 oz (63.4 kg)   LMP 02/29/2024   BMI 25.57 kg/m    Physical Exam Vitals and nursing note reviewed.  Constitutional:      Appearance: Normal appearance.  HENT:     Head: Normocephalic and atraumatic.  Pulmonary:     Effort: Pulmonary effort is normal.  Skin:    General: Skin is warm and dry.  Neurological:      General: No focal deficit present.     Mental Status: She is alert.  Psychiatric:        Mood and Affect: Mood normal.        Behavior: Behavior normal.        Thought Content: Thought content normal.        Judgment: Judgment normal.      Labs and Imaging FINDINGS: Uterus   Measurements: Anteverted measuring 9.2 x 6.7 x 7.5 cm = volume: 239.3 mL. 3 fibroids identified:   In the fundus there is a transmural partially exophytic heterogeneous fibroid measuring 4.3 x 3.9 x 3.1 cm   There is a peripherally calcified ventral transmural fibroid in the body of uterus measuring 3.9 x 3.6 x 4.0 cm   In the anterior body of uterus there is a mural fibroid measuring 2.6 x 2.0 x 2.1 cm.   These were seen previously and slightly larger previously. No new abnormality is seen.   The cervix is not fully visualized due to bowel gas shadowing in the area but the visualized portion is unremarkable.   Endometrium   Thickness: 5.5 mm.  No focal abnormality visualized.   Right ovary   Measurements: 3.6 x 1.6 x 2.3 cm = volume: 6.8 mL. Normal appearance/no adnexal mass. There is preserved color flow registration.  Left ovary   Measurements: 2.8 x 1.6 x 2.8 cm = volume: 6.6 mL. Normal appearance/no adnexal mass. There is preservation of the color flow to this ovary.   Other findings   No abnormal free fluid.   IMPRESSION: 1. Uterine fibroids as described above. 2. The endometrium is normal in thickness. 3. The ovaries are normal in appearance. 4. No free fluid in the pelvis.   ADDENDUM REPORT: 01/13/2024 17:27   ADDENDUM: An additional uterine mass is seen, omitted from the original report. Exophytic, heterogeneously hypoechoic, partially calcified mass extends into the right hemipelvis, measuring 5.8 x 5.7 x 5.0 cm, previously 6.6 x 6.5 x 5.9 cm.        Assessment & Plan:  1. Fibroid (Primary) 2. Abnormal uterine bleeding (AUB) Reviewed imaging and prior  examination confirming enlarged fibroid. Discussed management options including continued medical management vs surgical management. Reviewed risks/benefits of surgery. If proceed with myomectomy, recommend pelvic MRI to get better idea of size and placement of fibroids. Ok with proceeding with MRI for now and will decide later if myomectomy desired, particularly given initial report did not include exophytic fundal fibroid.   Will switch OCP for now given headaches with current dose.   - Uterine fibroids: The patient's fibroids are symptomatic and treatment options of expectant management, medical therapy, and surgical therapy were discussed. - Expectant management - The patient's fibroids were discussed and expectant management was offered with strict precautions. - TXA - this medication was discussed as a means to control vaginal bleeding. Discussed it would not impact growth of the fibroids either way. Its main advantage is avoiding hormonal or surgical therapy and gives an option for therapy besides expectant management.  - We discussed progesterone only options including - POP, Depo Provera and Lng-IUD.  We reviewed risks and benefits and proper use. Progesterone Only Birth Control Pills (POP)- The use of progesterone only birth control pills was discussed with the patient. The control of fibroid symptoms was discussed and the risks/benefits of therapy were discussed. The fact that this type of therapy does not contain estrogen was discussed with the patient.  Proper use was also discussed. We reviewed possible induction of amenorrhea with each options as well as the possibility of break through bleeding.  - We discussed the GnRH-agonists and antagonists. Reviewed both short term and long term impact of these medications. Reviewed short term impact of Depo Lupron (agonist) on bleeding.  - We discussed surgical/procedural options available: RFA (I.e. Sonata), UAE, myomectomy and hysterectomy. We  discussed the risks and benefits for each of these specific procedures. For UAE, recommended preop MRI and referral to interventional radiology.  For the sonata, reviewed that we would need to sign a special consent form for this procedure. We discussed the types and sizes of fibroids that are candidates for hysteroscopic resection of fibroids as well.  - Following counseling, the patient would like: OCPs  - Norethindrone  Acetate-Ethinyl Estradiol  (JUNEL 1.5/30) 1.5-30 MG-MCG tablet; Take 1 tablet by mouth daily.  Dispense: 28 tablet; Refill: 11 - MR PELVIS W WO CONTRAST; Future - CBC - HgB A1c - TSH  3. Screening examination for STI - RPR+HBsAg+HCVAb+...    Carter Quarry, MD Minimally Invasive Gynecologic Surgery Center for [facility], [facility] "

## 2024-03-16 LAB — RPR+HBSAG+HCVAB+...
HIV Screen 4th Generation wRfx: NONREACTIVE
Hep C Virus Ab: NONREACTIVE
Hepatitis B Surface Ag: NEGATIVE
RPR Ser Ql: NONREACTIVE

## 2024-03-16 LAB — HEMOGLOBIN A1C
Est. average glucose Bld gHb Est-mCnc: 105 mg/dL
Hgb A1c MFr Bld: 5.3 % (ref 4.8–5.6)

## 2024-03-16 LAB — CBC
Hematocrit: 39.6 % (ref 34.0–46.6)
Hemoglobin: 12.7 g/dL (ref 11.1–15.9)
MCH: 29.5 pg (ref 26.6–33.0)
MCHC: 32.1 g/dL (ref 31.5–35.7)
MCV: 92 fL (ref 79–97)
Platelets: 194 x10E3/uL (ref 150–450)
RBC: 4.3 x10E6/uL (ref 3.77–5.28)
RDW: 12.5 % (ref 11.7–15.4)
WBC: 5.9 x10E3/uL (ref 3.4–10.8)

## 2024-03-16 LAB — TSH: TSH: 0.69 u[IU]/mL (ref 0.450–4.500)

## 2024-03-19 ENCOUNTER — Ambulatory Visit: Payer: Self-pay | Admitting: Obstetrics and Gynecology

## 2024-03-30 ENCOUNTER — Ambulatory Visit (HOSPITAL_COMMUNITY)
Admission: RE | Admit: 2024-03-30 | Discharge: 2024-03-30 | Disposition: A | Discharge status: Home / Self Care | Source: Ambulatory Visit | Attending: Obstetrics and Gynecology | Admitting: Obstetrics and Gynecology

## 2024-03-30 DIAGNOSIS — N939 Abnormal uterine and vaginal bleeding, unspecified: Secondary | ICD-10-CM | POA: Diagnosis present

## 2024-03-30 DIAGNOSIS — D219 Benign neoplasm of connective and other soft tissue, unspecified: Secondary | ICD-10-CM | POA: Insufficient documentation

## 2024-03-30 MED ORDER — GADOBUTROL 1 MMOL/ML IV SOLN
6.0000 mL | Freq: Once | INTRAVENOUS | Status: AC | PRN
  Administered 2024-03-30: 6 mL via INTRAVENOUS

## 2024-04-11 ENCOUNTER — Other Ambulatory Visit (HOSPITAL_COMMUNITY)
Admission: RE | Admit: 2024-04-11 | Discharge: 2024-04-11 | Disposition: A | Discharge status: Home / Self Care | Source: Ambulatory Visit | Attending: Family Medicine | Admitting: Family Medicine

## 2024-04-11 ENCOUNTER — Other Ambulatory Visit: Payer: Self-pay

## 2024-04-11 ENCOUNTER — Ambulatory Visit (INDEPENDENT_AMBULATORY_CARE_PROVIDER_SITE_OTHER): Payer: Self-pay | Admitting: *Deleted

## 2024-04-11 VITALS — BP 116/79 | HR 90 | Ht 64.0 in | Wt 139.6 lb

## 2024-04-11 DIAGNOSIS — N898 Other specified noninflammatory disorders of vagina: Secondary | ICD-10-CM | POA: Insufficient documentation

## 2024-04-11 NOTE — Progress Notes (Signed)
 She is here for a self swab for c/o intermittent thick white vaginal discharge with clots. She also c/o vaginal itching. Self swab obtained.She requested full wet prep.  I explained if anything positive she will be notified with results and treatment . She voice understanding. Rock Skip PEAK

## 2024-04-12 LAB — CERVICOVAGINAL ANCILLARY ONLY
Bacterial Vaginitis (gardnerella): NEGATIVE
Candida Glabrata: NEGATIVE
Candida Vaginitis: POSITIVE — AB
Chlamydia: NEGATIVE
Comment: NEGATIVE
Comment: NEGATIVE
Comment: NEGATIVE
Comment: NEGATIVE
Comment: NEGATIVE
Comment: NORMAL
Neisseria Gonorrhea: NEGATIVE
Trichomonas: NEGATIVE

## 2024-04-13 ENCOUNTER — Other Ambulatory Visit: Payer: Self-pay | Admitting: Obstetrics and Gynecology

## 2024-04-13 ENCOUNTER — Ambulatory Visit: Payer: Self-pay | Admitting: Family Medicine

## 2024-04-13 DIAGNOSIS — B3731 Acute candidiasis of vulva and vagina: Secondary | ICD-10-CM

## 2024-04-13 MED ORDER — FLUCONAZOLE 150 MG PO TABS: 150.0000 mg | ORAL_TABLET | Freq: Once | ORAL | 1 refills | Status: AC

## 2024-04-13 NOTE — Progress Notes (Signed)
 Rx sent for vaginal yeast  Brenda Buddle, MD

## 2024-04-27 MED ORDER — FLUCONAZOLE 150 MG PO TABS: 150.0000 mg | ORAL_TABLET | Freq: Once | ORAL | 0 refills | Status: AC
